# Patient Record
Sex: Male | Born: 1935 | Race: White | Hispanic: No | Marital: Married | Smoking: Never smoker
Health system: Southern US, Community
[De-identification: ages and names within clinical notes are randomized; demographics above are authoritative.]

## PROBLEM LIST (undated history)

## (undated) DIAGNOSIS — C61 Malignant neoplasm of prostate: Secondary | ICD-10-CM

## (undated) DIAGNOSIS — I10 Essential (primary) hypertension: Secondary | ICD-10-CM

## (undated) DIAGNOSIS — N200 Calculus of kidney: Secondary | ICD-10-CM

## (undated) DIAGNOSIS — I639 Cerebral infarction, unspecified: Secondary | ICD-10-CM

## (undated) DIAGNOSIS — Z87442 Personal history of urinary calculi: Secondary | ICD-10-CM

## (undated) HISTORY — PX: SHOULDER SURGERY: SHX246

## (undated) HISTORY — PX: KIDNEY STONE SURGERY: SHX686

---

## 2006-06-12 ENCOUNTER — Ambulatory Visit: Payer: Self-pay | Admitting: Internal Medicine

## 2007-06-13 ENCOUNTER — Ambulatory Visit: Payer: Self-pay | Admitting: Family Medicine

## 2007-11-30 ENCOUNTER — Ambulatory Visit: Payer: Self-pay | Admitting: Family Medicine

## 2007-12-02 ENCOUNTER — Ambulatory Visit: Payer: Self-pay | Admitting: Internal Medicine

## 2008-03-29 ENCOUNTER — Ambulatory Visit: Payer: Self-pay | Admitting: Family Medicine

## 2008-04-08 ENCOUNTER — Ambulatory Visit: Payer: Self-pay | Admitting: Internal Medicine

## 2009-06-19 ENCOUNTER — Ambulatory Visit: Payer: Self-pay | Admitting: Internal Medicine

## 2009-12-28 ENCOUNTER — Ambulatory Visit: Payer: Self-pay | Admitting: Ophthalmology

## 2010-03-23 ENCOUNTER — Ambulatory Visit: Payer: Self-pay | Admitting: Otolaryngology

## 2011-09-19 ENCOUNTER — Ambulatory Visit: Payer: Self-pay | Admitting: Ophthalmology

## 2012-03-01 DEATH — deceased

## 2012-09-01 LAB — COMPREHENSIVE METABOLIC PANEL
Albumin: 4 g/dL (ref 3.4–5.0)
Alkaline Phosphatase: 216 U/L — ABNORMAL HIGH (ref 50–136)
Anion Gap: 4 — ABNORMAL LOW (ref 7–16)
BUN: 19 mg/dL — ABNORMAL HIGH (ref 7–18)
Bilirubin,Total: 0.8 mg/dL (ref 0.2–1.0)
Chloride: 107 mmol/L (ref 98–107)
Creatinine: 1.03 mg/dL (ref 0.60–1.30)
Glucose: 124 mg/dL — ABNORMAL HIGH (ref 65–99)
Potassium: 4.2 mmol/L (ref 3.5–5.1)
SGOT(AST): 87 U/L — ABNORMAL HIGH (ref 15–37)
SGPT (ALT): 39 U/L (ref 12–78)
Total Protein: 7.9 g/dL (ref 6.4–8.2)

## 2012-09-01 LAB — CBC
HCT: 45 % (ref 40.0–52.0)
MCH: 32.6 pg (ref 26.0–34.0)
MCHC: 35.9 g/dL (ref 32.0–36.0)
MCV: 91 fL (ref 80–100)
Platelet: 211 10*3/uL (ref 150–440)
RBC: 4.95 10*6/uL (ref 4.40–5.90)
RDW: 12.8 % (ref 11.5–14.5)
WBC: 6.9 10*3/uL (ref 3.8–10.6)

## 2012-09-01 LAB — URINALYSIS, COMPLETE
Bacteria: NONE SEEN
Bilirubin,UR: NEGATIVE
Glucose,UR: NEGATIVE mg/dL (ref 0–75)
Ketone: NEGATIVE
Ph: 6 (ref 4.5–8.0)
Specific Gravity: 1.009 (ref 1.003–1.030)
Squamous Epithelial: NONE SEEN
WBC UR: <1 /HPF (ref 0–5)

## 2012-09-01 LAB — TROPONIN I: Troponin-I: <0.02 ng/mL

## 2012-09-02 ENCOUNTER — Inpatient Hospital Stay: Payer: Self-pay

## 2012-09-02 LAB — PROTIME-INR
INR: 1
Prothrombin Time: 13.5 secs (ref 11.5–14.7)

## 2012-09-02 LAB — CBC WITH DIFFERENTIAL/PLATELET
Basophil #: 0.1 10*3/uL (ref 0.0–0.1)
Eosinophil #: 0.2 10*3/uL (ref 0.0–0.7)
Eosinophil %: 3.1 %
HCT: 40.4 % (ref 40.0–52.0)
Lymphocyte #: 1.7 10*3/uL (ref 1.0–3.6)
Lymphocyte %: 26.3 %
MCH: 32.1 pg (ref 26.0–34.0)
MCHC: 35.7 g/dL (ref 32.0–36.0)
MCV: 90 fL (ref 80–100)
Neutrophil %: 59.6 %
RDW: 13.1 % (ref 11.5–14.5)
WBC: 6.5 10*3/uL (ref 3.8–10.6)

## 2012-09-02 LAB — COMPREHENSIVE METABOLIC PANEL
Anion Gap: 6 — ABNORMAL LOW (ref 7–16)
Bilirubin,Total: 0.7 mg/dL (ref 0.2–1.0)
Calcium, Total: 8.4 mg/dL — ABNORMAL LOW (ref 8.5–10.1)
Chloride: 111 mmol/L — ABNORMAL HIGH (ref 98–107)
Co2: 25 mmol/L (ref 21–32)
Creatinine: 1.01 mg/dL (ref 0.60–1.30)
EGFR (African American): >60
EGFR (Non-African Amer.): >60
Osmolality: 285 (ref 275–301)
SGPT (ALT): 36 U/L (ref 12–78)
Sodium: 142 mmol/L (ref 136–145)
Total Protein: 6.6 g/dL (ref 6.4–8.2)

## 2012-09-02 LAB — TROPONIN I
Troponin-I: <0.02 ng/mL
Troponin-I: <0.02 ng/mL

## 2012-09-02 LAB — LIPID PANEL
Cholesterol: 152 mg/dL (ref 0–200)
Ldl Cholesterol, Calc: 99 mg/dL (ref 0–100)

## 2013-06-25 ENCOUNTER — Ambulatory Visit: Payer: Self-pay | Admitting: Emergency Medicine

## 2013-10-22 DIAGNOSIS — M659 Synovitis and tenosynovitis, unspecified: Secondary | ICD-10-CM | POA: Diagnosis not present

## 2014-01-12 DIAGNOSIS — C61 Malignant neoplasm of prostate: Secondary | ICD-10-CM | POA: Diagnosis not present

## 2014-01-12 DIAGNOSIS — N529 Male erectile dysfunction, unspecified: Secondary | ICD-10-CM | POA: Diagnosis not present

## 2014-01-12 DIAGNOSIS — D400 Neoplasm of uncertain behavior of prostate: Secondary | ICD-10-CM | POA: Diagnosis not present

## 2014-01-12 DIAGNOSIS — E785 Hyperlipidemia, unspecified: Secondary | ICD-10-CM | POA: Diagnosis not present

## 2014-01-14 DIAGNOSIS — I10 Essential (primary) hypertension: Secondary | ICD-10-CM | POA: Diagnosis not present

## 2014-01-14 DIAGNOSIS — R059 Cough, unspecified: Secondary | ICD-10-CM | POA: Diagnosis not present

## 2014-01-14 DIAGNOSIS — R05 Cough: Secondary | ICD-10-CM | POA: Diagnosis not present

## 2014-01-14 DIAGNOSIS — R509 Fever, unspecified: Secondary | ICD-10-CM | POA: Diagnosis not present

## 2014-01-18 DIAGNOSIS — J309 Allergic rhinitis, unspecified: Secondary | ICD-10-CM | POA: Diagnosis not present

## 2014-01-18 DIAGNOSIS — I10 Essential (primary) hypertension: Secondary | ICD-10-CM | POA: Diagnosis not present

## 2014-01-18 DIAGNOSIS — J189 Pneumonia, unspecified organism: Secondary | ICD-10-CM | POA: Diagnosis not present

## 2014-01-18 DIAGNOSIS — R05 Cough: Secondary | ICD-10-CM | POA: Diagnosis not present

## 2014-01-18 DIAGNOSIS — E785 Hyperlipidemia, unspecified: Secondary | ICD-10-CM | POA: Diagnosis not present

## 2014-01-18 DIAGNOSIS — R059 Cough, unspecified: Secondary | ICD-10-CM | POA: Diagnosis not present

## 2014-01-20 DIAGNOSIS — H43819 Vitreous degeneration, unspecified eye: Secondary | ICD-10-CM | POA: Diagnosis not present

## 2014-02-26 ENCOUNTER — Ambulatory Visit: Payer: Self-pay

## 2014-02-26 DIAGNOSIS — R059 Cough, unspecified: Secondary | ICD-10-CM | POA: Diagnosis not present

## 2014-02-26 DIAGNOSIS — M949 Disorder of cartilage, unspecified: Secondary | ICD-10-CM | POA: Diagnosis not present

## 2014-02-26 DIAGNOSIS — R918 Other nonspecific abnormal finding of lung field: Secondary | ICD-10-CM | POA: Diagnosis not present

## 2014-02-26 DIAGNOSIS — J9819 Other pulmonary collapse: Secondary | ICD-10-CM | POA: Diagnosis not present

## 2014-02-26 DIAGNOSIS — M899 Disorder of bone, unspecified: Secondary | ICD-10-CM | POA: Diagnosis not present

## 2014-07-13 DIAGNOSIS — N5201 Erectile dysfunction due to arterial insufficiency: Secondary | ICD-10-CM | POA: Diagnosis not present

## 2014-07-13 DIAGNOSIS — D400 Neoplasm of uncertain behavior of prostate: Secondary | ICD-10-CM | POA: Diagnosis not present

## 2014-07-13 DIAGNOSIS — C61 Malignant neoplasm of prostate: Secondary | ICD-10-CM | POA: Diagnosis not present

## 2014-08-03 DIAGNOSIS — Z23 Encounter for immunization: Secondary | ICD-10-CM | POA: Diagnosis not present

## 2014-09-01 DIAGNOSIS — Z79899 Other long term (current) drug therapy: Secondary | ICD-10-CM | POA: Diagnosis not present

## 2014-09-01 DIAGNOSIS — Z0000 Encounter for general adult medical examination without abnormal findings: Secondary | ICD-10-CM | POA: Diagnosis not present

## 2014-09-06 DIAGNOSIS — Z961 Presence of intraocular lens: Secondary | ICD-10-CM | POA: Diagnosis not present

## 2014-09-08 DIAGNOSIS — I10 Essential (primary) hypertension: Secondary | ICD-10-CM | POA: Diagnosis not present

## 2014-09-08 DIAGNOSIS — I639 Cerebral infarction, unspecified: Secondary | ICD-10-CM | POA: Diagnosis not present

## 2014-09-08 DIAGNOSIS — E785 Hyperlipidemia, unspecified: Secondary | ICD-10-CM | POA: Diagnosis not present

## 2014-09-08 DIAGNOSIS — E119 Type 2 diabetes mellitus without complications: Secondary | ICD-10-CM | POA: Diagnosis not present

## 2014-11-01 DIAGNOSIS — L720 Epidermal cyst: Secondary | ICD-10-CM | POA: Diagnosis not present

## 2014-11-16 DIAGNOSIS — Z4802 Encounter for removal of sutures: Secondary | ICD-10-CM | POA: Diagnosis not present

## 2015-01-12 DIAGNOSIS — D400 Neoplasm of uncertain behavior of prostate: Secondary | ICD-10-CM | POA: Diagnosis not present

## 2015-01-18 NOTE — Consult Note (Signed)
Brief Consult Note: Diagnosis: right occipital stroke - Rt. PCA.   Patient was seen by consultant.   Consult note dictated.   Comments: 1) transient left sided visual difficulty, mild right periorbital pain - still has some difficulty recognizing objection in far left visual field. - Agree with incresing ASA to 325, conti statin, ACE. - should get OSA eval as out pt (not many other vascular risk factors but has extensive WM disease on MRI brain). - No driving till formal perimetry by opthal as out pt - discussed with Dr. Ola Spurr.  Electronic Signatures: Ray Church (MD)  (Signed 03-Dec-13 13:44)  Authored: Brief Consult Note   Last Updated: 03-Dec-13 13:44 by Ray Church (MD)

## 2015-01-18 NOTE — Consult Note (Signed)
PATIENT NAME:  Darryl Roman, HERRINGTON MR#:  [id number] DATE OF BIRTH:  Jul 21, 1933  DATE OF CONSULTATION:  09/02/2012  REFERRING PHYSICIAN:  Margaretha Sheffield, MD CONSULTING PHYSICIAN:  Argenis Kumari K. Manuella Ghazi, MD  PRIMARY CARE PHYSICIAN: Adrian Prows, MD  REASON FOR CONSULTATION: Visual field defect and concern for stroke and headache.   HISTORY OF PRESENT ILLNESS: Darryl Roman is a 81 year old Caucasian gentleman in very good health in general, plays sports on a regular basis, etc. He had feeling of not good on Thursday, 08/28/2012. He could not describe to me further what he meant by not feeling well. He did not describe palpitations or any other focal neurological symptoms that day.   He said Friday and Saturday were "good days". On Sunday, around 10:40 in the morning when he was driving to church, he did not recognize two cars coming from the left side when he made a right turn.   He also had some difficulty recognizing other cars into his left visual field.   Later on in the day, he had some periorbital headache on the right side. He thought it might be coming from his sinuses and went to see Dr. Kathyrn Sheriff.  He did not find much signs of inflammatory sinusitis and advised the patient to go to the ER after discussing the patient with me on the telephone.   Since admission, the patient has not had any new symptoms. On his MRI of the brain, he does have a positive DWI lesion on his right occipital region.   PAST MEDICAL HISTORY:  1. Prostate cancer. 2. Hypertension. 3. Cataract.   PAST SURGICAL HISTORY: Cataract surgery bilaterally in 2012.   FAMILY HISTORY: Significant for stroke in his father in his 53s and a brother with two silent strokes.   SOCIAL HISTORY: Negative for smoking. No alcohol. No drug abuse. He lives with his wife. He is very active in sports.   MEDICATIONS: I reviewed his home medication list.   REVIEW OF SYSTEMS: Negative, except for History of Present Illness.   PHYSICAL  EXAMINATION:   VITAL SIGNS: Temperature 97.9, pulse 60, respiratory rate 20, blood pressure 165/69, and pulse oximetry 96% on room air.   GENERAL: He is a well-built, well-nourished Caucasian gentleman lying in bed, not in acute distress. He was actually eating.   PULMONARY: Lungs are clear to auscultation.   CARDIAC: S1 and S2 heart sounds. Carotid exam did not reveal any bruit.   NEUROLOGIC: He was alert and oriented and followed two-step inverted commands. His language was intact. He does not have any neurological neglect. His attention, concentration, and memory seem to be appropriate for his age.   On his cranial nerves, his pupils are equal, round, and reactive. Extraocular movements were intact. He does have some difficulty in his left visual field, upper and lower quadrant, recognizing finger movements. He feels like there is a hand,  but he just could not recognize whether the fingers were moving or not.   His face was symmetric. Tongue was midline. Facial sensations were intact. His hearing seems to be intact as well.   On his motor examination, he moved all his extremities symmetrically. His strength was five out of five. His reflexes were 1+. His sensations were intact to light touch. His coordination seems to be okay.   I did not check his gait.   RADIOLOGIC STUDIES: On review of his radiologic data, on MRI of the brain he does have right anterior temporal subarachnoid cyst.  He has a  new DWI lesion suggestive of cytotoxic edema into his right occipital cortex, more occipitotemporal region, in inferior surface.  He also has extensive white matter microvascular ischemic changes.   ASSESSMENT AND PLAN:  1. Stroke with left visual field difficulties rather than hemianopsia. He has more difficulty with recognizing objects into his left hemifield compared to the right hemifield.   Evidenced by he was not able to recognize cars coming from his left side when he was driving.    The patient denied any history suggestive of transient ischemic attack or palpitations, etc.   This vascular distribution is right posterior cerebral artery.   I agree with the patient's stroke work-up of telemetry monitoring, echocardiogram, carotid ultrasound, etc.   On his lipid panel, his total cholesterol is 152, triglyceride is 138, HDL is 25, and LDL is 99.   The patient should get visual rehab and I talked to him about ways of improving vision and be cognisant about visual field deficit.   For now he will not drive until he gets formal perimetry done by ophthalmology as an outpatient.   In terms of treatment, he was taking aspirin 81 mg. He  can be switched over to aspirin 325 mg, as Dr. Adrian Prows has done.   The patient should continue to take his statin. In the future, niacin can be considered to elevate his HDL.   The patient should avoid drastic lowering of his blood pressure in peristroke period to avoid any worsening of his penumbra, but in the long run he should have much tighter blood pressure control.   The patient does not have diabetes or smoking history.   The patient mentioned that he snores and his wife has noticed some apneic spells at nighttime, but he does not have excessive daytime sleepiness or fatigue or early morning headaches or mouth dryness, etc., but he should be considered for obstructive sleep apnea evaluation which can be one of the vascular risk factors for him.   I will follow this patient as an outpatient in 1 to 2 months.  2. 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 He does not require any further intervention.   3. Extensive white matter microvascular ischemic changes puts him at high risk of developing vascular cognitive decline in the future, but the patient has a very good healthy lifestyle that he should continue.  ____________________________ Treylon Henard K. Manuella Ghazi,  MD hks:slb D: 09/02/2012 13:53:40 ET T: 09/02/2012 14:10:54 ET JOB#: [id number]  cc: Geniece Akers K. Manuella Ghazi, MD, <Dictator> Cheral Marker. Ola Spurr, MD Huey Romans, MD [facility] Raliegh Ip [facility] MD ELECTRONICALLY SIGNED 09/10/2012 9:41

## 2015-01-18 NOTE — H&P (Signed)
PATIENT NAME:  Darryl Roman, Darryl Roman MR#:  [id number] DATE OF BIRTH:  03-Oct-1932  DATE OF ADMISSION:  09/01/2012  PRIMARY CARE PHYSICIAN: Dr. Andrey Farmer and now will be seeing Dr. Ola Spurr  REQUESTING PHYSICIAN: Dr. Michel Santee  CHIEF COMPLAINT: Blurry vision, headache and concern for mini stroke.   HISTORY OF PRESENT ILLNESS: Patient is a 81 year old white male with a known history of prostate cancer, hypertension is being admitted to rule out stroke and/or possible mini stroke. Patient started having sinus-like symptoms since yesterday while having a lot of tenderness in the right maxillary and frontal sinus area, went to see Dr. Kathyrn Sheriff. He was also having trouble focusing and had some blurred vision last Tuesday then it resolved, again started having similar issue during Thanksgiving holiday. He was also having a lot of pain under his right eyeball and when he was driving today he felt blind spot on the left side and almost felt like he was going to get into accident as he could not see for a few seconds. Dr. Maisie Fus office checked him out and requested him to come to the Emergency Department for evaluation of stroke. While in the ED he had a CT scan of the head which did not show any acute intracranial abnormality. He is being admitted for further evaluation and management.   PAST MEDICAL HISTORY:  1. Prostate cancer. 2. Hypertension.   ALLERGIES: No known drug allergies.   SOCIAL HISTORY: No smoking. No alcohol. No IV drugs of abuse.   FAMILY HISTORY: Father had a stroke in his 76s.    PAST SURGICAL HISTORY: Cataract surgery in 2012 on both sides.   MEDICATIONS AT HOME:  1. Benazepril 10 mg p.o. daily.  2. Aspirin 81 mg p.o. daily. 3. Amlodipine 5 mg p.o. daily.   REVIEW OF SYSTEMS: CONSTITUTIONAL: No fever, fatigue, weakness. EYES: Positive for blurry vision and some blind spot in it. ENT: Positive for some sinus headache on the right side. Also has hearing difficulty which is chronic.  LUNGS: No cough, wheezing, hemoptysis. CARDIOVASCULAR: No chest pain, orthopnea, edema. GASTROINTESTINAL: No nausea, vomiting, diarrhea. GENITOURINARY: No dysuria or hematuria. ENDOCRINE: No polyuria or nocturia. HEMATOLOGY: No anemia or easy bruising. SKIN: No rash or lesion. MUSCULOSKELETAL: No arthritis or muscle cramp. NEUROLOGIC: No tingling, numbness, weakness. PSYCHIATRIC: No history of anxiety or depression.   PHYSICAL EXAMINATION:  VITAL SIGNS: Temperature 98, heart rate 88 per minute, respirations 18 per minute, blood pressure 193/90 mmHg. He is saturating 100% on room air.  GENERAL: Patient is a 81 year old male lying in the bed comfortably without any acute distress.   EYES: Pupils equal, round, reactive to light and accommodation. No scleral icterus. Extraocular muscles intact.   HENT: Head atraumatic, normocephalic. Oropharynx and nasopharynx clear.   NECK: Supple. No jugular venous distention. No thyroid enlargement or tenderness.   LUNGS: Clear to auscultation bilaterally. No wheezing, rales, rhonchi, crepitation.   CARDIOVASCULAR: S1, S2 normal. No murmurs, rales or gallop.   ABDOMEN: Soft, nontender, nondistended. Bowel sounds present. No organomegaly, masses.    EXTREMITIES: No pedal edema, cyanosis, clubbing.   NEUROLOGIC: Nonfocal examination. Cranial nerves II through XII intact. Muscle strength 5/5. Extremity sensation intact.   PSYCH: Patient is oriented to time, place and person x3.   SKIN: No obvious rash, lesion, ulcer.   LABORATORY, DIAGNOSTIC AND RADIOLOGICAL DATA: Normal BMP. Normal liver function tests except alkaline phosphatase of 216.   AST of 87.   Normal CBC.  Negative urinalysis.  EKG showed no acute  ST-T changes.   CT scan of the head without contrast in the ED showed chronic small vessel ischemic disease. No acute intracranial hemorrhage. Frontal sinus mucoperiosteal thickening. Bifrontal cerebral atrophy and atrophy of the anterior aspect  of the right temporal lobe.   He does have decreased density in the deep white matter of both cerebral hemispheres consistent with chronic small vessel ischemic disease.   Bilateral carotid Doppler's on admission showed no hemodynamically significant stenosis.   IMPRESSION AND PLAN:  1. Suspected transient ischemic attack/cerebrovascular accident. Will obtain MRI of the brain, get carotid Doppler's and obtain 2-D echo. Will start him on aspirin and statin and blood pressure medicine to get his blood pressure under better control. This could be due to possible acute cerebrovascular accident.  2. Uncontrolled hypertension. Again could be due to acute cerebrovascular accident or transient ischemic attack. Will add hydralazine for better blood pressure control and continue rest of his home blood pressure medications.  3. Blurry vision/headache. Could be due to possible transient ischemic attack and/or cerebrovascular accident. He already had an ENT ruling out sinus disease. If stroke work-up comes negative he may need outpatient ophthalmology follow up for further evaluation.  4. CODE STATUS: Full code.   TOTAL TIME TAKING CARE OF THIS PATIENT: 55 minutes.  ____________________________ Lucina Mellow. Manuella Ghazi, MD vss:cms D: 09/01/2012 17:27:17 ET T: 09/01/2012 17:49:46 ET  JOB#: [id number] cc: Cheral Marker. Ola Spurr, MD [place] MD ELECTRONICALLY SIGNED 09/02/2012 21:12

## 2015-01-18 NOTE — Discharge Summary (Signed)
PATIENT NAME:  Darryl Roman, Darryl Roman MR#:  [id number] DATE OF BIRTH:  07/10/1933  DATE OF ADMISSION:  09/02/2012 DATE OF DISCHARGE:  09/03/2012  DISCHARGE DIAGNOSES:  1. Acute cerebrovascular accident in the parietal occipital lobe presenting with visual field loss.  2. Hypertensive urgency.   PRIMARY CARE PHYSICIAN: Dr. Adrian Prows   CONSULTING: Neurologist, Dr. Manuella Ghazi.   HISTORY OF PRESENT ILLNESS: Please see admission history and physical from 12/02. Briefly, patient is 83, relatively healthy except for hypertension who was admitted with several days of recurring visual symptoms including difficulty seeing cars and also right-sided headache. He was seen by Dr. Kathyrn Sheriff in ENT and referred to the ED for evaluation of transient ischemic attack.   HOSPITAL COURSE BY ISSUE:  1. Cerebrovascular accident. Patient was diagnosed with an acute cerebrovascular accident based on an MRI that showed the findings in the right parietal occipital lobe. On admission he was started on atorvastatin as well as increased from 81 mg of aspirin to 325. His blood pressure was allowed to remain somewhat high but his medications were adjusted as needed. He had no rehab or physical therapy needs as he was mobile but persisted with some visual field deficits. He will follow up as an outpatient with Dr. Manuella Ghazi as well as myself. He will also see ophthalmology for formal visual field testing.  2. Hypertension. Blood pressure was quite elevated on admission in the setting of CVA. He was allowed to have some permissive hypertension. However, his Norvasc was increased to 10 mg from 5 mg. He was continued on benazepril 10 mg once a day. He was also started on hydralazine 10 mg q.i.d. He will be discharged on these medications and adjusted as an outpatient. He would probably benefit from addition of a mild diuretic in place of the hydralazine as an outpatient.   LABORATORY, DIAGNOSTIC AND RADIOLOGICAL DATA: Significant findings: MRI  showed acute nonhemorrhagic right parietal occipital infarct. Echocardiogram revealed normal ejection fraction at 55%. Mild left atrial dilation and mild mitral regurgitation. Tricuspid valve was not well visualized. Rest of the exam was normal. Carotid Doppler's revealed no significant hemodynamic compromise. LDL level was 99. C-MET and urinalysis and CBC were normal. Troponin levels were negative.   DISCHARGE MEDICATIONS:  1. Aspirin 325 once a day.  2. Norvasc 10 mg once a day.  3. Hydralazine 10 mg tablets 1 tablet q.i.d.  4. Atorvastatin 20 mg once a day.  5. Benazepril 10 mg once a day.   DISPOSITION: Discharged to home.   DISCHARGE INSTRUCTIONS: Patient is to continue activities as tolerated. He should not drive until he has formal ophthalmological testing for visual fields. He is to call or return if he has worsening vision, headaches, or other concerning symptoms.   DISCHARGE DIET: Low salt, low fat diet.   DISCHARGE FOLLOW UP: Patient will follow up with me in 1 to 2 weeks and with Dr. Manuella Ghazi in one month. Patient will also arrange ophthalmologic evaluation.   TIME SPENT: This discharge took 35 minutes.   ____________________________ Cheral Marker. Ola Spurr, MD dpf:cms D: 09/03/2012 10:12:53 ET T: 09/03/2012 12:05:38 ET JOB#: [id number]  cc: Cheral Marker. Ola Spurr, MD, <Dictator> [place] K. Manuella Ghazi, MD [place] MD ELECTRONICALLY SIGNED 09/18/2012 12:57

## 2015-03-07 DIAGNOSIS — I10 Essential (primary) hypertension: Secondary | ICD-10-CM | POA: Diagnosis not present

## 2015-03-07 DIAGNOSIS — E785 Hyperlipidemia, unspecified: Secondary | ICD-10-CM | POA: Diagnosis not present

## 2015-03-07 DIAGNOSIS — E119 Type 2 diabetes mellitus without complications: Secondary | ICD-10-CM | POA: Diagnosis not present

## 2015-03-14 DIAGNOSIS — I10 Essential (primary) hypertension: Secondary | ICD-10-CM | POA: Diagnosis not present

## 2015-03-14 DIAGNOSIS — R7989 Other specified abnormal findings of blood chemistry: Secondary | ICD-10-CM | POA: Diagnosis not present

## 2015-03-14 DIAGNOSIS — I639 Cerebral infarction, unspecified: Secondary | ICD-10-CM | POA: Diagnosis not present

## 2015-03-14 DIAGNOSIS — E780 Pure hypercholesterolemia: Secondary | ICD-10-CM | POA: Diagnosis not present

## 2015-03-14 DIAGNOSIS — R5382 Chronic fatigue, unspecified: Secondary | ICD-10-CM | POA: Diagnosis not present

## 2015-03-15 ENCOUNTER — Other Ambulatory Visit: Payer: Self-pay | Admitting: Infectious Diseases

## 2015-03-15 DIAGNOSIS — I639 Cerebral infarction, unspecified: Secondary | ICD-10-CM

## 2015-03-15 DIAGNOSIS — R945 Abnormal results of liver function studies: Secondary | ICD-10-CM

## 2015-03-15 DIAGNOSIS — I10 Essential (primary) hypertension: Secondary | ICD-10-CM

## 2015-03-15 DIAGNOSIS — R7989 Other specified abnormal findings of blood chemistry: Secondary | ICD-10-CM

## 2015-03-15 DIAGNOSIS — E7800 Pure hypercholesterolemia, unspecified: Secondary | ICD-10-CM

## 2015-03-18 ENCOUNTER — Ambulatory Visit
Admission: RE | Admit: 2015-03-18 | Discharge: 2015-03-18 | Disposition: A | Discharge status: Home / Self Care | Payer: Medicare Other | Source: Ambulatory Visit | Attending: Infectious Diseases | Admitting: Infectious Diseases

## 2015-03-18 DIAGNOSIS — R945 Abnormal results of liver function studies: Secondary | ICD-10-CM

## 2015-03-18 DIAGNOSIS — I10 Essential (primary) hypertension: Secondary | ICD-10-CM | POA: Diagnosis not present

## 2015-03-18 DIAGNOSIS — R7989 Other specified abnormal findings of blood chemistry: Secondary | ICD-10-CM | POA: Insufficient documentation

## 2015-03-18 DIAGNOSIS — I639 Cerebral infarction, unspecified: Secondary | ICD-10-CM

## 2015-03-18 DIAGNOSIS — E7800 Pure hypercholesterolemia, unspecified: Secondary | ICD-10-CM

## 2015-03-18 DIAGNOSIS — N281 Cyst of kidney, acquired: Secondary | ICD-10-CM | POA: Diagnosis not present

## 2015-03-18 DIAGNOSIS — N261 Atrophy of kidney (terminal): Secondary | ICD-10-CM | POA: Diagnosis not present

## 2015-08-12 DIAGNOSIS — Z23 Encounter for immunization: Secondary | ICD-10-CM | POA: Diagnosis not present

## 2015-09-09 DIAGNOSIS — R7989 Other specified abnormal findings of blood chemistry: Secondary | ICD-10-CM | POA: Diagnosis not present

## 2015-09-09 DIAGNOSIS — I10 Essential (primary) hypertension: Secondary | ICD-10-CM | POA: Diagnosis not present

## 2015-09-09 DIAGNOSIS — I639 Cerebral infarction, unspecified: Secondary | ICD-10-CM | POA: Diagnosis not present

## 2015-09-09 DIAGNOSIS — E7800 Pure hypercholesterolemia, unspecified: Secondary | ICD-10-CM | POA: Diagnosis not present

## 2015-09-16 DIAGNOSIS — Z8546 Personal history of malignant neoplasm of prostate: Secondary | ICD-10-CM | POA: Diagnosis not present

## 2015-09-16 DIAGNOSIS — Z23 Encounter for immunization: Secondary | ICD-10-CM | POA: Diagnosis not present

## 2015-09-16 DIAGNOSIS — I639 Cerebral infarction, unspecified: Secondary | ICD-10-CM | POA: Diagnosis not present

## 2015-09-16 DIAGNOSIS — I10 Essential (primary) hypertension: Secondary | ICD-10-CM | POA: Diagnosis not present

## 2015-09-16 DIAGNOSIS — K219 Gastro-esophageal reflux disease without esophagitis: Secondary | ICD-10-CM | POA: Diagnosis not present

## 2015-12-30 DIAGNOSIS — H35371 Puckering of macula, right eye: Secondary | ICD-10-CM | POA: Diagnosis not present

## 2016-02-01 ENCOUNTER — Telehealth: Payer: Self-pay | Admitting: Infectious Diseases

## 2016-02-01 ENCOUNTER — Ambulatory Visit: Payer: Medicare Other

## 2016-02-01 ENCOUNTER — Ambulatory Visit
Admission: EM | Admit: 2016-02-01 | Discharge: 2016-02-01 | Disposition: A | Discharge status: Home / Self Care | Payer: Medicare Other | Attending: Family Medicine | Admitting: Family Medicine

## 2016-02-01 ENCOUNTER — Encounter: Payer: Self-pay | Admitting: Emergency Medicine

## 2016-02-01 ENCOUNTER — Ambulatory Visit
Admit: 2016-02-01 | Discharge: 2016-02-01 | Disposition: A | Discharge status: Home / Self Care | Payer: Medicare Other | Attending: Family Medicine | Admitting: Family Medicine

## 2016-02-01 DIAGNOSIS — I2510 Atherosclerotic heart disease of native coronary artery without angina pectoris: Secondary | ICD-10-CM | POA: Diagnosis not present

## 2016-02-01 DIAGNOSIS — Z8673 Personal history of transient ischemic attack (TIA), and cerebral infarction without residual deficits: Secondary | ICD-10-CM | POA: Insufficient documentation

## 2016-02-01 DIAGNOSIS — R791 Abnormal coagulation profile: Secondary | ICD-10-CM

## 2016-02-01 DIAGNOSIS — M4324 Fusion of spine, thoracic region: Secondary | ICD-10-CM | POA: Diagnosis not present

## 2016-02-01 DIAGNOSIS — I313 Pericardial effusion (noninflammatory): Secondary | ICD-10-CM | POA: Diagnosis not present

## 2016-02-01 DIAGNOSIS — S8012XA Contusion of left lower leg, initial encounter: Secondary | ICD-10-CM

## 2016-02-01 DIAGNOSIS — M7989 Other specified soft tissue disorders: Secondary | ICD-10-CM | POA: Diagnosis not present

## 2016-02-01 DIAGNOSIS — R05 Cough: Secondary | ICD-10-CM | POA: Insufficient documentation

## 2016-02-01 DIAGNOSIS — R509 Fever, unspecified: Secondary | ICD-10-CM | POA: Insufficient documentation

## 2016-02-01 DIAGNOSIS — Z9889 Other specified postprocedural states: Secondary | ICD-10-CM | POA: Diagnosis not present

## 2016-02-01 DIAGNOSIS — Z79899 Other long term (current) drug therapy: Secondary | ICD-10-CM | POA: Diagnosis not present

## 2016-02-01 DIAGNOSIS — Z7982 Long term (current) use of aspirin: Secondary | ICD-10-CM | POA: Diagnosis not present

## 2016-02-01 DIAGNOSIS — R059 Cough, unspecified: Secondary | ICD-10-CM

## 2016-02-01 DIAGNOSIS — R7989 Other specified abnormal findings of blood chemistry: Secondary | ICD-10-CM

## 2016-02-01 HISTORY — DX: Calculus of kidney: N20.0

## 2016-02-01 HISTORY — DX: Essential (primary) hypertension: I10

## 2016-02-01 HISTORY — DX: Cerebral infarction, unspecified: I63.9

## 2016-02-01 LAB — URINALYSIS COMPLETE WITH MICROSCOPIC (ARMC ONLY)
Bacteria, UA: NONE SEEN
GLUCOSE, UA: NEGATIVE mg/dL
HGB URINE DIPSTICK: NEGATIVE
Ketones, ur: NEGATIVE mg/dL
Leukocytes, UA: NEGATIVE
NITRITE: NEGATIVE
PH: 5.5 (ref 5.0–8.0)
Protein, ur: 30 mg/dL — AB
SPECIFIC GRAVITY, URINE: 1.025 (ref 1.005–1.030)

## 2016-02-01 LAB — CBC WITH DIFFERENTIAL/PLATELET
Basophils Absolute: 0.1 10*3/uL (ref 0–0.1)
EOS ABS: 0.4 10*3/uL (ref 0–0.7)
HEMATOCRIT: 45.9 % (ref 40.0–52.0)
Hemoglobin: 15.9 g/dL (ref 13.0–18.0)
Lymphocytes Relative: 8 %
Lymphs Abs: 0.8 10*3/uL — ABNORMAL LOW (ref 1.0–3.6)
MCH: 31.7 pg (ref 26.0–34.0)
MCHC: 34.6 g/dL (ref 32.0–36.0)
MCV: 91.5 fL (ref 80.0–100.0)
MONO ABS: 1 10*3/uL (ref 0.2–1.0)
NEUTROS ABS: 7.7 10*3/uL — AB (ref 1.4–6.5)
Neutrophils Relative %: 77 %
Platelets: 200 10*3/uL (ref 150–440)
RBC: 5.02 MIL/uL (ref 4.40–5.90)
RDW: 12.8 % (ref 11.5–14.5)
WBC: 10 10*3/uL (ref 3.8–10.6)

## 2016-02-01 LAB — BASIC METABOLIC PANEL
Anion gap: 6 (ref 5–15)
BUN: 24 mg/dL — AB (ref 6–20)
CO2: 24 mmol/L (ref 22–32)
Calcium: 8.9 mg/dL (ref 8.9–10.3)
Chloride: 108 mmol/L (ref 101–111)
Creatinine, Ser: 1.21 mg/dL (ref 0.61–1.24)
GFR, EST NON AFRICAN AMERICAN: 55 mL/min — AB (ref >60)
GLUCOSE: 102 mg/dL — AB (ref 65–99)
Potassium: 3.8 mmol/L (ref 3.5–5.1)
Sodium: 138 mmol/L (ref 135–145)

## 2016-02-01 LAB — RAPID INFLUENZA A&B ANTIGENS: Influenza B (ARMC): NEGATIVE

## 2016-02-01 LAB — RAPID INFLUENZA A&B ANTIGENS (ARMC ONLY): INFLUENZA A (ARMC): NEGATIVE

## 2016-02-01 LAB — FIBRIN DERIVATIVES D-DIMER (ARMC ONLY): FIBRIN DERIVATIVES D-DIMER (ARMC): 1075 — AB (ref 0–499)

## 2016-02-01 MED ORDER — IOPAMIDOL (ISOVUE-370) INJECTION 76%
100.0000 mL | Freq: Once | INTRAVENOUS | Status: AC | PRN
  Administered 2016-02-01: 100 mL via INTRAVENOUS

## 2016-02-01 MED ORDER — CEFUROXIME AXETIL 500 MG PO TABS: 500.0000 mg | ORAL_TABLET | Freq: Two times a day (BID) | ORAL | Status: AC

## 2016-02-01 MED ORDER — ACETAMINOPHEN 500 MG PO TABS: 1000.0000 mg | ORAL_TABLET | Freq: Four times a day (QID) | ORAL | Status: DC | PRN

## 2016-02-01 MED ORDER — CEFUROXIME AXETIL 500 MG PO TABS: 500.0000 mg | ORAL_TABLET | Freq: Two times a day (BID) | ORAL | Status: DC

## 2016-02-01 NOTE — Telephone Encounter (Signed)
Patient and spouse notified of CT Chest/angio results via telephone.  Case discussed with Dr Alveta Heimlich.  Will treat for possible early pneumonia ceftin 500mg  po BID as possible early cellulitis left lower extremity as previously discussed with Dr Posey Pronto during shift change also.  Patient to follow up with PCM if any worsening or no improvement of chest symptoms/fever after 48 hours on antibiotics.  Patient or spouse may pick up copy of CT radiology report at front desk during office hours.   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  Patient and spouse verbalized understanding information/instructions, agreed with plan of care and had no further questions at this time.  CLINICAL DATA: Elevated D-dimer. Cough. History of trauma to the left lower extremity. Evaluate for pulmonary embolism  EXAM: CT ANGIOGRAPHY CHEST WITH CONTRAST  TECHNIQUE: Multidetector CT imaging of the chest was performed using the standard protocol during bolus administration of intravenous contrast. Multiplanar CT image reconstructions and MIPs were obtained to evaluate the vascular anatomy.  CONTRAST: 100 cc Isovue 370  COMPARISON: Chest CTA -02/26/2014  FINDINGS: Vascular Findings:  There is adequate opacification of the pulmonary arterial system with the main pulmonary artery measuring 366 Hounsfield units. There are no discrete filling defects within the pulmonary arterial tree to suggest pulmonary embolism. Normal caliber of the main pulmonary artery.  Normal heart size. Small pericardial effusion, likely physiologic. Coronary artery calcifications.  Moderate amount of eccentric slightly irregular mixed calcified and noncalcified atherosclerotic plaque throughout the thoracic aorta, not resulting in a hemodynamically significant stenosis. The thoracic aorta is of normal caliber without evidence of thoracic aortic dissection is nongated examination. Conventional configuration of the  aortic arch. The branch vessels of the aortic arch are widely patent throughout their imaged course.  Review of the MIP images confirms the above findings.   ----------------------------------------------------------------------------------  Nonvascular Findings:  Mediastinum/Lymph Nodes: Supraclavicular, hilar and mediastinal lymph nodes appear similar to the 02/26/2014 examination with index right supraclavicular lymph node measuring 0.9 cm in greatest short axis diameter (image 9, series 4) index precarinal nodal conglomeration measuring approximately 1.5 cm (image 60, series 4), index right suprahilar lymph node measuring approximately 1.4 cm (image 69) and index left infrahilar lymph node measuring approximately 1.2 cm (image 85, series 4), which given stability 01/2014 examination is favored to be reactive in etiology. Approximately 1.3 cm presumed lymph node adjacent to the right-side of the T9-T10 intervertebral disc space (image 107, series 4) is also unchanged since 02/26/2014 examination. No axillary lymphadenopathy.  Lungs/Pleura: Minimal dependent subpleural ground-glass atelectasis. There is minimal subsegmental atelectasis within the left lower lobe. No focal airspace opacities. No pleural effusion or pneumothorax. The central pulmonary airways appear widely patent.  Punctate areas of nodular pleural parenchymal thickening about the known lung apex is grossly unchanged since the 01/2019 06/2014 examination (representative image 33, series 6). No discrete pulmonary nodules.  Upper abdomen: Limited early arterial phase evaluation of the upper abdomen demonstrates a punctate (approximately 0.7 cm hypo attenuating lesion within the subcapsular aspect of the lateral segment of the left lobe of the liver which is too small to adequately characterize though morphologically similar to the 01/2014 examination and favored to represent a hepatic  cyst.  Musculoskeletal: No acute or aggressive osseous abnormalities. 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 of DISH throughout the thoracic spine. Mild bilateral symmetric gynecomastia. Post bilateral rotator cuff repair. Normal appearance of the thyroid gland. Suspected atrophy involving the left kidney, incompletely imaged.  IMPRESSION: 1. No  acute cardiopulmonary disease. Specifically, no evidence of pulmonary embolism. 2. Atherosclerosis including coronary artery calcifications. 3. Supraclavicular, mediastinal, hilar and paraspinal adenopathy is unchanged since the 01/2014 examination and thus favored to be of benign etiology, potentially reactive or inflammatory in etiology. 4. 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   Electronically Signed  By: Sandi Mariscal M.D.  On: 02/01/2016 16:23

## 2016-02-01 NOTE — ED Provider Notes (Signed)
CSN: [id number]     Arrival date & time 02/01/16  1118 History   First MD Initiated Contact with Patient 02/01/16 1130     Chief Complaint  Patient presents with  . Cough  . Fever   (Consider location/radiation/quality/duration/timing/severity/associated sxs/prior Treatment) HPI: Patient presents today with history of fever yesterday. Patient has had a cough for the last few days. He denies any chest pain or shortness of breath. The last time he took Tylenol was 2 AM. Patient also states that he was hit by a softball to his left lower leg about a week and a half ago. Patient states that there is some bruising and swelling and tenderness of the site still. He does not state that it is worse in any way. He denies any calf pain. He denies any history of DVT in the past. He denies any skin breaks over the left lower leg. There was no bleeding or discharge from the site. He denies nausea, vomiting, abdominal pain, headache, urinary symptoms. Did have two non-bloody loose stools yesterday. Patient admits to having the influenza vaccination this season and pneumococcal vaccine.  Past Medical History  Diagnosis Date  . Stroke ([place])   . Kidney stones    Past Surgical History  Procedure Laterality Date  . Shoulder surgery     History reviewed. No pertinent family history. Social History  Substance Use Topics  . Smoking status: Never Smoker   . Smokeless tobacco: None  . Alcohol Use: No    Review of Systems: Negative except mentioned above.   Allergies  Review of patient's allergies indicates no known allergies.  Home Medications   Prior to Admission medications   Medication Sig Start Date End Date Taking? Authorizing Provider  amLODipine (NORVASC) 10 MG tablet Take 10 mg by mouth daily.   Yes Historical Provider, MD  aspirin 81 MG tablet Take 81 mg by mouth daily.   Yes Historical Provider, MD  atorvastatin (LIPITOR) 10 MG tablet Take 10 mg by mouth daily.   Yes Historical Provider, MD   losartan (COZAAR) 25 MG tablet Take 25 mg by mouth daily.   Yes Historical Provider, MD  Misc Natural Products (OSTEO BI-FLEX ADV DOUBLE ST PO) Take 1 tablet by mouth 2 (two) times daily.   Yes Historical Provider, MD  vitamin B-12 (CYANOCOBALAMIN) 1000 MCG tablet Take 1,000 mcg by mouth daily.   Yes Historical Provider, MD   Meds Ordered and Administered this Visit  Medications - No data to display  BP 128/57 mmHg  Pulse 67  Temp(Src) 97.6 F (36.4 C) (Tympanic)  Resp 16  Ht 5\' 9"  (1.753 m)  Wt 192 lb (87.091 kg)  BMI 28.34 kg/m2  SpO2 97% No data found.   Physical Exam   GENERAL: NAD HEENT: mild pharyngeal erythema, no exudate, no erythema of TMs, no cervical LAD RESP: CTA B CARD: RRR ABD: +BS, NT, no flank tenderness MSK: LLE- small hematoma over anterior lower leg, mild ecchymosis and tenderness of the area, no significant warmth compared to the right, -[place], FROM, no streaks, no skin breaks appreciated   ED Course  Procedures (including critical care time)  Labs Review Labs Reviewed  RAPID INFLUENZA A&B ANTIGENS ([facility] ONLY)    Imaging Review No results found.   MDM  A/P: Cough, fever, left lower extremity injury and swelling- x-rays did not show any acute process, white blood cell count was 10, given elevated d-dimer will do CT chest to rule out PE, if negative will likely  treat for cellulitis of the lower extremity with close follow-up by PMD or here. I am signing out case to Gracy Bruins nurse practitioner.    Paulina Fusi, MD 02/01/16 1416

## 2016-02-01 NOTE — Discharge Instructions (Signed)
Viral Gastroenteritis °Viral gastroenteritis is also known as stomach flu. This condition affects the stomach and intestinal tract. It can cause sudden diarrhea and vomiting. The illness typically lasts 3 to 8 days. Most people develop an immune response that eventually gets rid of the virus. While this natural response develops, the virus can make you quite ill. °CAUSES  °Many different viruses can cause gastroenteritis, such as rotavirus or noroviruses. You can catch one of these viruses by consuming contaminated food or water. You may also catch a virus by sharing utensils or other personal items with an infected person or by touching a contaminated surface. °SYMPTOMS  °The most common symptoms are diarrhea and vomiting. These problems can cause a severe loss of body fluids (dehydration) and a body salt (electrolyte) imbalance. Other symptoms may include: °· Fever. °· Headache. °· Fatigue. °· Abdominal pain. °DIAGNOSIS  °Your caregiver can usually diagnose viral gastroenteritis based on your symptoms and a physical exam. A stool sample may also be taken to test for the presence of viruses or other infections. °TREATMENT  °This illness typically goes away on its own. Treatments are aimed at rehydration. The most serious cases of viral gastroenteritis involve vomiting so severely that you are not able to keep fluids down. In these cases, fluids must be given through an intravenous line (IV). °HOME CARE INSTRUCTIONS  °· Drink enough fluids to keep your urine clear or pale yellow. Drink small amounts of fluids frequently and increase the amounts as tolerated. °· Ask your caregiver for specific rehydration instructions. °· Avoid: °¨ Foods high in sugar. °¨ Alcohol. °¨ Carbonated drinks. °¨ Tobacco. °¨ Juice. °¨ Caffeine drinks. °¨ Extremely hot or cold fluids. °¨ Fatty, greasy foods. °¨ Too much intake of anything at one time. °¨ Dairy products until 24 to 48 hours after diarrhea stops. °· You may consume probiotics.  Probiotics are active cultures of beneficial bacteria. They may lessen the amount and number of diarrheal stools in adults. Probiotics can be found in yogurt with active cultures and in supplements. °· Wash your hands well to avoid spreading the virus. °· Only take over-the-counter or prescription medicines for pain, discomfort, or fever as directed by your caregiver. Do not give aspirin to children. Antidiarrheal medicines are not recommended. °· Ask your caregiver if you should continue to take your regular prescribed and over-the-counter medicines. °· Keep all follow-up appointments as directed by your caregiver. °SEEK IMMEDIATE MEDICAL CARE IF:  °· You are unable to keep fluids down. °· You do not urinate at least once every 6 to 8 hours. °· You develop shortness of breath. °· You notice blood in your stool or vomit. This may look like coffee grounds. °· You have abdominal pain that increases or is concentrated in one small area (localized). °· You have persistent vomiting or diarrhea. °· You have a fever. °· The patient is a child younger than 3 months, and he or she has a fever. °· The patient is a child older than 3 months, and he or she has a fever and persistent symptoms. °· The patient is a child older than 3 months, and he or she has a fever and symptoms suddenly get worse. °· The patient is a baby, and he or she has no tears when crying. °MAKE SURE YOU:  °· Understand these instructions. °· Will watch your condition. °· Will get help right away if you are not doing well or get worse. °  °This information is not intended to replace   advice given to you by your health care provider. Make sure you discuss any questions you have with your health care provider.   Document Released: 09/17/2005 Document Revised: 12/10/2011 Document Reviewed: 07/04/2011 Elsevier Interactive Patient Education 2016 [employer] Viral Infections A virus is a type of germ. Viruses can cause:  Minor sore throats.  Aches and  pains.  Headaches.  Runny nose.  Rashes.  Watery eyes.  Tiredness.  Coughs.  Loss of appetite.  Feeling sick to your stomach (nausea).  Throwing up (vomiting).  Watery poop (diarrhea). HOME CARE   Only take medicines as told by your doctor.  Drink enough water and fluids to keep your pee (urine) clear or pale yellow. Sports drinks are a good choice.  Get plenty of rest and eat healthy. Soups and broths with crackers or rice are fine. GET HELP RIGHT AWAY IF:   You have a very bad headache.  You have shortness of breath.  You have chest pain or neck pain.  You have an unusual rash.  You cannot stop throwing up.  You have watery poop that does not stop.  You cannot keep fluids down.  You or your child has a temperature by mouth above 102 F (38.9 C), not controlled by medicine.  Your baby is older than 3 months with a rectal temperature of 102 F (38.9 C) or higher.  Your baby is 58 months old or younger with a rectal temperature of 100.4 F (38 C) or higher. MAKE SURE YOU:   Understand these instructions.  Will watch this condition.  Will get help right away if you are not doing well or get worse.   This information is not intended to replace advice given to you by your health care provider. Make sure you discuss any questions you have with your health care provider.   Document Released: 08/30/2008 Document Revised: 12/10/2011 Document Reviewed: 02/23/2015 Elsevier Interactive Patient Education 2016 [employer] Cellulitis Cellulitis is an infection of the skin and the tissue beneath it. The infected area is usually red and tender. Cellulitis occurs most often in the arms and lower legs.  CAUSES  Cellulitis is caused by bacteria that enter the skin through cracks or cuts in the skin. The most common types of bacteria that cause cellulitis are staphylococci and streptococci. SIGNS AND SYMPTOMS   Redness and warmth.  Swelling.  Tenderness or  pain.  Fever. DIAGNOSIS  Your health care provider can usually determine what is wrong based on a physical exam. Blood tests may also be done. TREATMENT  Treatment usually involves taking an antibiotic medicine. HOME CARE INSTRUCTIONS   Take your antibiotic medicine as directed by your health care provider. Finish the antibiotic even if you start to feel better.  Keep the infected arm or leg elevated to reduce swelling.  Apply a warm cloth to the affected area up to 4 times per day to relieve pain.  Take medicines only as directed by your health care provider.  Keep all follow-up visits as directed by your health care provider. SEEK MEDICAL CARE IF:   You notice red streaks coming from the infected area.  Your red area gets larger or turns dark in color.  Your bone or joint underneath the infected area becomes painful after the skin has healed.  Your infection returns in the same area or another area.  You notice a swollen bump in the infected area.  You develop new symptoms.  You have a fever. [place]  IF:   You feel very sleepy.  You develop vomiting or diarrhea.  You have a general ill feeling (malaise) with muscle aches and pains.   This information is not intended to replace advice given to you by your health care provider. Make sure you discuss any questions you have with your health care provider.   Document Released: 06/27/2005 Document Revised: 06/08/2015 Document Reviewed: 12/03/2011 Elsevier Interactive Patient Education 2016 [employer] Pulmonary Embolism A pulmonary embolism (PE) is a sudden blockage or decrease of blood flow in one lung or both lungs. Most blockages come from a blood clot that travels from the legs or the pelvis to the lungs. PE is a dangerous and potentially life-threatening condition if it is not treated right away. CAUSES A pulmonary embolism occurs most commonly when a blood clot travels from one of your veins to  your lungs. Rarely, PE is caused by air, fat, amniotic fluid, or part of a tumor traveling through your veins to your lungs. RISK FACTORS A PE is more likely to develop in:  People who smoke.  People who areolder, especially over 109 years of age.  People who are overweight (obese).  People who sit or lie still for a long time, such as during long-distance travel (over 4 hours), bed rest, hospitalization, or during recovery from certain medical conditions like a stroke.  People who do not engage in much physical activity (sedentary lifestyle).  People who have chronic breathing disorders.  People whohave a personal or family history of blood clots or blood clotting disease.  People whohave peripheral vascular disease (PVD), diabetes, or some types of cancer.  People who haveheart disease, especially if the person had a recent heart attack or has congestive heart failure.  People who have neurological diseases that affect the legs (leg paresis).  People who have had a traumatic injury, such as breaking a hip or leg.  People whohave recently had major or lengthy surgery, especially on the hip, knee, or abdomen.  People who have hada central line placed inside a large vein.  People who takemedicines that contain the hormone estrogen. These include birth control pills and hormone replacement therapy.  Pregnancy or during childbirth or the postpartum period. SIGNS AND SYMPTOMS  The symptoms of a PE usually start suddenly and include:  Shortness of breath while active or at rest.  Coughing or coughing up blood or blood-tinged mucus.  Chest pain that is often worse with deep breaths.  Rapid or irregular heartbeat.  Feeling light-headed or dizzy.  Fainting.  Feelinganxious.  Sweating. There may also be pain and swelling in a leg if that is where the blood clot started. These symptoms may represent a serious problem that is an emergency. Do not wait to see if the  symptoms will go away. Get medical help right away. Call your local emergency services (911 in the [place] Do not drive yourself to the hospital. DIAGNOSIS Your health care provider will take a medical history and perform a physical exam. You may also have other tests, including:  Blood tests to assess the clotting properties of your blood, assess oxygen levels in your blood, and find blood clots.  Imaging tests, such as CT, ultrasound, MRI, X-ray, and other tests to see if you have clots anywhere in your body.  An electrocardiogram (ECG) to look for heart strain from blood clots in the lungs. TREATMENT The main goals of PE treatment are:  To stop a blood clot from growing larger.  To  stop new blood clots from forming. The type of treatment that you receive depends on many factors, such as the cause of your PE, your risk for bleeding or developing more clots, and other medical conditions that you have. Sometimes, a combination of treatments is necessary. This condition may be treated with:  Medicines, including newer oral blood thinners (anticoagulants), warfarin, low molecular weight heparins, thrombolytics, or heparins.  Wearing compression stockings or using different types of devices.  Surgery (rare) to remove the blood clot or to place a filter in your abdomen to stop the blood clot from traveling to your lungs. Treatments for a PE are often divided into immediate treatment, long-term treatment (up to 3 months after PE), and extended treatment (more than 3 months after PE). Your treatment may continue for several months. This is called maintenance therapy, and it is used to prevent the forming of new blood clots. You can work with your health care provider to choose the treatment program that is best for you. What are anticoagulants? Anticoagulants are medicines that treat PEs. They can stop current blood clots from growing and stop new clots from forming. They cannot dissolve existing  clots. Your body dissolves clots by itself over time. Anticoagulants are given by mouth, by injection, or through an IV tube. What are thrombolytics? Thrombolytics are clot-dissolving medicines that are used to dissolve a PE. They carry a high risk of bleeding, so they tend to be used only in severe cases or if you have very low blood pressure. HOME CARE INSTRUCTIONS If you are taking a newer oral anticoagulant:  Take the medicine every single day at the same time each day.  Understand what foods and drugs interact with this medicine.  Understand that there are no regular blood tests required when using this medicine.  Understandthe side effects of this medicine, including excessive bruising or bleeding. Ask your health care provider or pharmacist about other possible side effects. If you are taking warfarin:  Understand how to take warfarin and know which foods can affect how warfarin works in Veterinary surgeon.  Understand that it is dangerous to taketoo much or too little warfarin. Too much warfarin increases the risk of bleeding. Too little warfarin continues to allow the risk for blood clots.  Follow your PT and INR blood testing schedule. The PT and INR results allow your health care provider to adjust your dose of warfarin. It is very important that you have your PT and INR tested as often as told by your health care provider.  Avoid major changes in your diet, or tell your health care provider before you change your diet. Arrange a visit with a registered dietitian to answer your questions. Many foods, especially foods that are high in vitamin K, can interfere with warfarin and affect the PT and INR results. Eat a consistent amount of foods that are high in vitamin K, such as:  Spinach, kale, broccoli, cabbage, collard greens, turnip greens, Brussels sprouts, peas, cauliflower, seaweed, and parsley.  Beef liver and pork liver.  Green tea.  Soybean oil.  Tell your health care  provider about any and all medicines, vitamins, and supplements that you take, including aspirin and other over-the-counter anti-inflammatory medicines. Be especially cautious with aspirin and anti-inflammatory medicines. Do not take those before you ask your health care provider if it is safe to do so. This is important because many medicines can interfere with warfarin and affect the PT and INR results.  Do not start or stop  taking any over-the-counter or prescription medicine unless your health care provider or pharmacist tells you to do so. If you take warfarin, you will also need to do these things:  Hold pressure over cuts for longer than usual.  Tell your dentist and other health care providers that you are taking warfarin before you have any procedures in which bleeding may occur.  Avoid alcohol or drink very small amounts. Tell your health care provider if you change your alcohol intake.  Do not use tobacco products, including cigarettes, chewing tobacco, and e-cigarettes. If you need help quitting, ask your health care provider.  Avoid contact sports. General Instructions  Take over-the-counter and prescription medicines only as told by your health care provider. Anticoagulant medicines can have side effects, including easy bruising and difficulty stopping bleeding. If you are prescribed an anticoagulant, you will also need to do these things:  Hold pressure over cuts for longer than usual.  Tell your dentist and other health care providers that you are taking anticoagulants before you have any procedures in which bleeding may occur.  Avoid contact sports.  Wear a medical alert bracelet or carry a medical alert card that says you have had a PE.  Ask your health care provider how soon you can go back to your normal activities. Stay active to prevent new blood clots from forming.  Make sure to exercise while traveling or when you have been sitting or standing for a long period of  time. It is very important to exercise. Exercise your legs by walking or by tightening and relaxing your leg muscles often. Take frequent walks.  Wear compression stockings as told by your health care provider to help prevent more blood clots from forming.  Do not use tobacco products, including cigarettes, chewing tobacco, and e-cigarettes. If you need help quitting, ask your health care provider.  Keep all follow-up appointments with your health care provider. This is important. PREVENTION Take these actions to decrease your risk of developing another PE:  Exercise regularly. For at least 30 minutes every day, engage in:  Activity that involves moving your arms and legs.  Activity that encourages good blood flow through your body by increasing your heart rate.  Exercise your arms and legs every hour during long-distance travel (over 4 hours). Drink plenty of water and avoid drinking alcohol while traveling.  Avoid sitting or lying in bed for long periods of time without moving your legs.  Maintain a weight that is appropriate for your height. Ask your health care provider what weight is healthy for you.  If you are a woman who is over 7 years of age, avoid unnecessary use of medicines that contain estrogen. These include birth control pills.  Do not smoke, especially if you take estrogen medicines. If you need help quitting, ask your health care provider.  If you are at very high risk for PE, wear compression stockings.  If you recently had a PE, have regularly scheduled ultrasound testing on your legs to check for new blood clots. If you are hospitalized, prevention measures may include:  Early walking after surgery, as soon as your health care provider says that it is safe.  Receiving anticoagulants to prevent blood clots. If you cannot take anticoagulants, other options may be available, such as wearing compression stockings or using different types of devices. SEEK IMMEDIATE  MEDICAL CARE IF:  You have new or increased pain, swelling, or redness in an arm or leg.  You have numbness or tingling  in an arm or leg.  You have shortness of breath while active or at rest.  You have chest pain.  You have a rapid or irregular heartbeat.  You feel light-headed or dizzy.  You cough up blood.  You notice blood in your vomit, bowel movement, or urine.  You have a fever. These symptoms may represent a serious problem that is an emergency. Do not wait to see if the symptoms will go away. Get medical help right away. Call your local emergency services (911 in the [place] Do not drive yourself to the hospital.   This information is not intended to replace advice given to you by your health care provider. Make sure you discuss any questions you have with your health care provider.   Document Released: 09/14/2000 Document Revised: 06/08/2015 Document Reviewed: 01/12/2015 Elsevier Interactive Patient Education 2016 [place] A contusion is a deep bruise. Contusions are the result of a blunt injury to tissues and muscle fibers under the skin. The injury causes bleeding under the skin. The skin overlying the contusion may turn blue, purple, or yellow. Minor injuries will give you a painless contusion, but more severe contusions may stay painful and swollen for a few weeks.  CAUSES  This condition is usually caused by a blow, trauma, or direct force to an area of the body. SYMPTOMS  Symptoms of this condition include:  Swelling of the injured area.  Pain and tenderness in the injured area.  Discoloration. The area may have redness and then turn blue, purple, or yellow. DIAGNOSIS  This condition is diagnosed based on a physical exam and medical history. An X-ray, CT scan, or MRI may be needed to determine if there are any associated injuries, such as broken bones (fractures). TREATMENT  Specific treatment for this condition depends on what area of the body  was injured. In general, the best treatment for a contusion is resting, icing, applying pressure to (compression), and elevating the injured area. This is often called the RICE strategy. Over-the-counter anti-inflammatory medicines may also be recommended for pain control.  HOME CARE INSTRUCTIONS   Rest the injured area.  If directed, apply ice to the injured area:  Put ice in a plastic bag.  Place a towel between your skin and the bag.  Leave the ice on for 20 minutes, 2-3 times per day.  If directed, apply light compression to the injured area using an elastic bandage. Make sure the bandage is not wrapped too tightly. Remove and reapply the bandage as directed by your health care provider.  If possible, raise (elevate) the injured area above the level of your heart while you are sitting or lying down.  Take over-the-counter and prescription medicines only as told by your health care provider. SEEK MEDICAL CARE IF:  Your symptoms do not improve after several days of treatment.  Your symptoms get worse.  You have difficulty moving the injured area. SEEK IMMEDIATE MEDICAL CARE IF:   You have severe pain.  You have numbness in a hand or foot.  Your hand or foot turns pale or cold.   This information is not intended to replace advice given to you by your health care provider. Make sure you discuss any questions you have with your health care provider.   Document Released: 06/27/2005 Document Revised: 06/08/2015 Document Reviewed: 02/02/2015 Elsevier Interactive Patient Education 2016 [employer] Hematoma A hematoma is a collection of blood under the skin, in an organ, in a body space, in a  joint space, or in other tissue. The blood can clot to form a lump that you can see and feel. The lump is often firm and may sometimes become sore and tender. Most hematomas get better in a few days to weeks. However, some hematomas may be serious and require medical care. Hematomas can range  in size from very small to very large. CAUSES  A hematoma can be caused by a blunt or penetrating injury. It can also be caused by spontaneous leakage from a blood vessel under the skin. Spontaneous leakage from a blood vessel is more likely to occur in older people, especially those taking blood thinners. Sometimes, a hematoma can develop after certain medical procedures. SIGNS AND SYMPTOMS   A firm lump on the body.  Possible pain and tenderness in the area.  Bruising.Blue, dark blue, purple-red, or yellowish skin may appear at the site of the hematoma if the hematoma is close to the surface of the skin. For hematomas in deeper tissues or body spaces, the signs and symptoms may be subtle. For example, an intra-abdominal hematoma may cause abdominal pain, weakness, fainting, and shortness of breath. An intracranial hematoma may cause a headache or symptoms such as weakness, trouble speaking, or a change in consciousness. DIAGNOSIS  A hematoma can usually be diagnosed based on your medical history and a physical exam. Imaging tests may be needed if your health care provider suspects a hematoma in deeper tissues or body spaces, such as the abdomen, head, or chest. These tests may include ultrasonography or a CT scan.  TREATMENT  Hematomas usually go away on their own over time. Rarely does the blood need to be drained out of the body. Large hematomas or those that may affect vital organs will sometimes need surgical drainage or monitoring. HOME CARE INSTRUCTIONS   Apply ice to the injured area:   Put ice in a plastic bag.   Place a towel between your skin and the bag.   Leave the ice on for 20 minutes, 2-3 times a day for the first 1 to 2 days.   After the first 2 days, switch to using warm compresses on the hematoma.   Elevate the injured area to help decrease pain and swelling. Wrapping the area with an elastic bandage may also be helpful. Compression helps to reduce swelling and  promotes shrinking of the hematoma. Make sure the bandage is not wrapped too tight.   If your hematoma is on a lower extremity and is painful, crutches may be helpful for a couple days.   Only take over-the-counter or prescription medicines as directed by your health care provider. SEEK IMMEDIATE MEDICAL CARE IF:   You have increasing pain, or your pain is not controlled with medicine.   You have a fever.   You have worsening swelling or discoloration.   Your skin over the hematoma breaks or starts bleeding.   Your hematoma is in your chest or abdomen and you have weakness, shortness of breath, or a change in consciousness.  Your hematoma is on your scalp (caused by a fall or injury) and you have a worsening headache or a change in alertness or consciousness. MAKE SURE YOU:   Understand these instructions.  Will watch your condition.  Will get help right away if you are not doing well or get worse.   This information is not intended to replace advice given to you by your health care provider. Make sure you discuss any questions you have with your  health care provider.   Document Released: 05/01/2004 Document Revised: 05/20/2013 Document Reviewed: 02/25/2013 Elsevier Interactive Patient Education [employer].

## 2016-02-01 NOTE — ED Notes (Signed)
Patient states that he was hit by a softball to his left lower leg a week ago.  Patient has redness, swelling and tenderness at the site on his left lower leg.  Patient also reports a cough that started 2 days.   Patient reports he had a fever last night.

## 2016-02-15 DIAGNOSIS — N5201 Erectile dysfunction due to arterial insufficiency: Secondary | ICD-10-CM | POA: Diagnosis not present

## 2016-02-15 DIAGNOSIS — R3915 Urgency of urination: Secondary | ICD-10-CM | POA: Diagnosis not present

## 2016-02-15 DIAGNOSIS — C61 Malignant neoplasm of prostate: Secondary | ICD-10-CM | POA: Diagnosis not present

## 2016-02-15 DIAGNOSIS — D400 Neoplasm of uncertain behavior of prostate: Secondary | ICD-10-CM | POA: Diagnosis not present

## 2016-02-23 DIAGNOSIS — C61 Malignant neoplasm of prostate: Secondary | ICD-10-CM | POA: Diagnosis not present

## 2016-02-23 DIAGNOSIS — R972 Elevated prostate specific antigen [PSA]: Secondary | ICD-10-CM | POA: Diagnosis not present

## 2016-02-23 DIAGNOSIS — D400 Neoplasm of uncertain behavior of prostate: Secondary | ICD-10-CM | POA: Diagnosis not present

## 2016-03-08 DIAGNOSIS — D400 Neoplasm of uncertain behavior of prostate: Secondary | ICD-10-CM | POA: Diagnosis not present

## 2016-03-08 DIAGNOSIS — C61 Malignant neoplasm of prostate: Secondary | ICD-10-CM | POA: Diagnosis not present

## 2016-03-08 DIAGNOSIS — R972 Elevated prostate specific antigen [PSA]: Secondary | ICD-10-CM | POA: Diagnosis not present

## 2016-03-12 DIAGNOSIS — K219 Gastro-esophageal reflux disease without esophagitis: Secondary | ICD-10-CM | POA: Diagnosis not present

## 2016-03-12 DIAGNOSIS — Z8546 Personal history of malignant neoplasm of prostate: Secondary | ICD-10-CM | POA: Diagnosis not present

## 2016-03-12 DIAGNOSIS — I10 Essential (primary) hypertension: Secondary | ICD-10-CM | POA: Diagnosis not present

## 2016-03-12 DIAGNOSIS — I639 Cerebral infarction, unspecified: Secondary | ICD-10-CM | POA: Diagnosis not present

## 2016-03-19 DIAGNOSIS — D400 Neoplasm of uncertain behavior of prostate: Secondary | ICD-10-CM | POA: Diagnosis not present

## 2016-03-19 DIAGNOSIS — I639 Cerebral infarction, unspecified: Secondary | ICD-10-CM | POA: Diagnosis not present

## 2016-03-19 DIAGNOSIS — K219 Gastro-esophageal reflux disease without esophagitis: Secondary | ICD-10-CM | POA: Diagnosis not present

## 2016-03-19 DIAGNOSIS — C61 Malignant neoplasm of prostate: Secondary | ICD-10-CM | POA: Diagnosis not present

## 2016-03-19 DIAGNOSIS — R05 Cough: Secondary | ICD-10-CM | POA: Diagnosis not present

## 2016-03-19 DIAGNOSIS — I10 Essential (primary) hypertension: Secondary | ICD-10-CM | POA: Diagnosis not present

## 2016-03-19 DIAGNOSIS — R972 Elevated prostate specific antigen [PSA]: Secondary | ICD-10-CM | POA: Diagnosis not present

## 2016-04-27 DIAGNOSIS — C61 Malignant neoplasm of prostate: Secondary | ICD-10-CM | POA: Diagnosis not present

## 2016-05-24 DIAGNOSIS — L570 Actinic keratosis: Secondary | ICD-10-CM | POA: Diagnosis not present

## 2016-05-24 DIAGNOSIS — Z872 Personal history of diseases of the skin and subcutaneous tissue: Secondary | ICD-10-CM | POA: Diagnosis not present

## 2016-05-24 DIAGNOSIS — Z09 Encounter for follow-up examination after completed treatment for conditions other than malignant neoplasm: Secondary | ICD-10-CM | POA: Diagnosis not present

## 2016-05-24 DIAGNOSIS — Z1283 Encounter for screening for malignant neoplasm of skin: Secondary | ICD-10-CM | POA: Diagnosis not present

## 2016-05-24 DIAGNOSIS — L723 Sebaceous cyst: Secondary | ICD-10-CM | POA: Diagnosis not present

## 2016-06-06 DIAGNOSIS — C61 Malignant neoplasm of prostate: Secondary | ICD-10-CM | POA: Diagnosis not present

## 2016-06-07 DIAGNOSIS — Z8546 Personal history of malignant neoplasm of prostate: Secondary | ICD-10-CM | POA: Diagnosis not present

## 2016-06-20 DIAGNOSIS — R972 Elevated prostate specific antigen [PSA]: Secondary | ICD-10-CM | POA: Diagnosis not present

## 2016-06-20 DIAGNOSIS — N5201 Erectile dysfunction due to arterial insufficiency: Secondary | ICD-10-CM | POA: Diagnosis not present

## 2016-06-20 DIAGNOSIS — C61 Malignant neoplasm of prostate: Secondary | ICD-10-CM | POA: Diagnosis not present

## 2016-06-20 DIAGNOSIS — N401 Enlarged prostate with lower urinary tract symptoms: Secondary | ICD-10-CM | POA: Diagnosis not present

## 2016-06-20 DIAGNOSIS — D400 Neoplasm of uncertain behavior of prostate: Secondary | ICD-10-CM | POA: Diagnosis not present

## 2016-08-02 DIAGNOSIS — Z23 Encounter for immunization: Secondary | ICD-10-CM | POA: Diagnosis not present

## 2016-08-08 DIAGNOSIS — Z8546 Personal history of malignant neoplasm of prostate: Secondary | ICD-10-CM | POA: Diagnosis not present

## 2016-09-12 DIAGNOSIS — I639 Cerebral infarction, unspecified: Secondary | ICD-10-CM | POA: Diagnosis not present

## 2016-09-12 DIAGNOSIS — K219 Gastro-esophageal reflux disease without esophagitis: Secondary | ICD-10-CM | POA: Diagnosis not present

## 2016-09-12 DIAGNOSIS — I10 Essential (primary) hypertension: Secondary | ICD-10-CM | POA: Diagnosis not present

## 2016-09-12 DIAGNOSIS — R05 Cough: Secondary | ICD-10-CM | POA: Diagnosis not present

## 2016-09-17 DIAGNOSIS — R351 Nocturia: Secondary | ICD-10-CM | POA: Diagnosis not present

## 2016-09-17 DIAGNOSIS — R972 Elevated prostate specific antigen [PSA]: Secondary | ICD-10-CM | POA: Diagnosis not present

## 2016-09-17 DIAGNOSIS — D400 Neoplasm of uncertain behavior of prostate: Secondary | ICD-10-CM | POA: Diagnosis not present

## 2016-09-17 DIAGNOSIS — C61 Malignant neoplasm of prostate: Secondary | ICD-10-CM | POA: Diagnosis not present

## 2016-09-19 DIAGNOSIS — I639 Cerebral infarction, unspecified: Secondary | ICD-10-CM | POA: Diagnosis not present

## 2016-09-19 DIAGNOSIS — K219 Gastro-esophageal reflux disease without esophagitis: Secondary | ICD-10-CM | POA: Diagnosis not present

## 2016-09-19 DIAGNOSIS — Z0000 Encounter for general adult medical examination without abnormal findings: Secondary | ICD-10-CM | POA: Diagnosis not present

## 2016-09-19 DIAGNOSIS — I10 Essential (primary) hypertension: Secondary | ICD-10-CM | POA: Diagnosis not present

## 2016-09-19 DIAGNOSIS — Z8546 Personal history of malignant neoplasm of prostate: Secondary | ICD-10-CM | POA: Diagnosis not present

## 2016-10-29 DIAGNOSIS — L853 Xerosis cutis: Secondary | ICD-10-CM | POA: Diagnosis not present

## 2016-10-29 DIAGNOSIS — L720 Epidermal cyst: Secondary | ICD-10-CM | POA: Diagnosis not present

## 2016-11-26 DIAGNOSIS — Z1211 Encounter for screening for malignant neoplasm of colon: Secondary | ICD-10-CM | POA: Diagnosis not present

## 2016-11-26 DIAGNOSIS — Z1212 Encounter for screening for malignant neoplasm of rectum: Secondary | ICD-10-CM | POA: Diagnosis not present

## 2016-12-06 DIAGNOSIS — C61 Malignant neoplasm of prostate: Secondary | ICD-10-CM | POA: Diagnosis not present

## 2017-01-23 ENCOUNTER — Ambulatory Visit
Admission: EM | Admit: 2017-01-23 | Discharge: 2017-01-23 | Disposition: A | Discharge status: Home / Self Care | Payer: Medicare Other | Attending: Family Medicine | Admitting: Family Medicine

## 2017-01-23 ENCOUNTER — Encounter: Payer: Self-pay | Admitting: *Deleted

## 2017-01-23 DIAGNOSIS — J069 Acute upper respiratory infection, unspecified: Secondary | ICD-10-CM | POA: Diagnosis not present

## 2017-01-23 DIAGNOSIS — R05 Cough: Secondary | ICD-10-CM | POA: Diagnosis not present

## 2017-01-23 MED ORDER — ALBUTEROL SULFATE HFA 108 (90 BASE) MCG/ACT IN AERS: 2.0000 | INHALATION_SPRAY | RESPIRATORY_TRACT | 1 refills | Status: DC | PRN

## 2017-01-23 MED ORDER — AZITHROMYCIN 250 MG PO TABS: ORAL_TABLET | ORAL | 0 refills | Status: DC

## 2017-01-23 MED ORDER — HYDROCOD POLST-CPM POLST ER 10-8 MG/5ML PO SUER: 5.0000 mL | Freq: Two times a day (BID) | ORAL | 0 refills | Status: DC | PRN

## 2017-01-23 NOTE — ED Triage Notes (Signed)
Productive cough- clear, chills, body aches, x1 week, onset of fever today 100.4.

## 2017-01-23 NOTE — ED Provider Notes (Signed)
[facility]    CSN: [id number] Arrival date & time: 01/23/17  1033     History   Chief Complaint Chief Complaint  Patient presents with  . Cough  . Generalized Body Aches  . Chills  . Fever    HPI Darryl Roman is a 83 y.o. male.   Patient is a 83 year old white male with some difficulty hearing comes in with his wife because of cough and congestion. This started about a week ago. His wife states that he's been active over the last several days he played in the special saw [employer] treatment over the weekend basically he doesn't hearing and his age group. He also has some tobacco on Monday but she states that the cough and congestion they thought was just coming from a cold in goingonforaboutaweekuntiltodaywhenhestartedrunningafeverandcoughingupmorethickmucus.Hasnotyellow-greenbutitismorethatithasbeen.Hehasahistoryofhypertensionwhichhiswifestatesthatsincehercontrolmedication.HeneversmokednohistoryCOPDemphysemaasthma.Longwithhistoryhypertensionhe'salsohadkidneystones. He has difficulty hearing. No pertinent family medical history no known drug allergies. Only surgery was shoulder surgery.   The history is provided by the patient. No language interpreter was used.  Cough  Cough characteristics:  Productive Sputum characteristics:  Nondescript Severity:  Moderate Timing:  Constant Progression:  Worsening Chronicity:  New Smoker: no   Context: sick contacts and upper respiratory infection   Relieved by:  Nothing Ineffective treatments:  None tried Associated symptoms: fever   Fever:    Duration:  6 hours   Timing:  Intermittent   Max temp PTA:  100 Risk factors: recent infection   Risk factors: no chemical exposure and no recent travel   Fever  Associated symptoms: cough     Past Medical History:  Diagnosis Date  . Hypertension   . Kidney stones   . Stroke [facility])     There are no active problems to display for this patient.   Past Surgical History:    Procedure Laterality Date  . SHOULDER SURGERY         Home Medications    Prior to Admission medications   Medication Sig Start Date End Date Taking? Authorizing Provider  amLODipine (NORVASC) 10 MG tablet Take 10 mg by mouth daily.   Yes Historical Provider, MD  aspirin EC 325 MG tablet Take 325 mg by mouth daily.   Yes Historical Provider, MD  atorvastatin (LIPITOR) 10 MG tablet Take 10 mg by mouth daily.   Yes Historical Provider, MD  losartan (COZAAR) 25 MG tablet Take 25 mg by mouth daily.   Yes Historical Provider, MD  Misc Natural Products (OSTEO BI-FLEX ADV DOUBLE ST PO) Take 1 tablet by mouth 2 (two) times daily.   Yes Historical Provider, MD  vitamin B-12 (CYANOCOBALAMIN) 1000 MCG tablet Take 1,000 mcg by mouth daily.   Yes Historical Provider, MD  acetaminophen (TYLENOL) 500 MG tablet Take 2 tablets (1,000 mg total) by mouth every 6 (six) hours as needed for moderate pain or fever. 02/01/16   Olen Cordial, NP  albuterol (PROVENTIL HFA;VENTOLIN HFA) 108 (90 Base) MCG/ACT inhaler Inhale 2 puffs into the lungs every 4 (four) hours as needed for wheezing or shortness of breath. 01/23/17   Frederich Cha, MD  aspirin 81 MG tablet Take 81 mg by mouth daily.    Historical Provider, MD  azithromycin (ZITHROMAX Z-PAK) 250 MG tablet Take 2 tablets first day and then 1 po a day for 4 days 01/23/17   Frederich Cha, MD  chlorpheniramine-HYDROcodone [facility] PENNKINETIC ER) 10-8 MG/5ML SUER Take 5 mLs by mouth every 12 (twelve) hours as needed. 01/23/17   Frederich Cha, MD    Family History History  reviewed. No pertinent family history.  Social History Social History  Substance Use Topics  . Smoking status: Never Smoker  . Smokeless tobacco: Never Used  . Alcohol use No     Allergies   Patient has no known allergies.   Review of Systems Review of Systems  Constitutional: Positive for fever.  Respiratory: Positive for cough.   All other systems reviewed and are  negative.    Physical Exam Triage Vital Signs ED Triage Vitals  Enc Vitals Group     BP 01/23/17 1100 133/69     Pulse Rate 01/23/17 1100 78     Resp 01/23/17 1100 16     Temp 01/23/17 1100 98.7 F (37.1 C)     Temp Source 01/23/17 1100 Oral     SpO2 01/23/17 1100 95 %     Weight 01/23/17 1102 195 lb (88.5 kg)     Height 01/23/17 1102 5\' 9"  (1.753 m)     Head Circumference --      Peak Flow --      Pain Score --      Pain Loc --      Pain Edu? --      Excl. in [place]? --    No data found.   Updated Vital Signs BP 133/69 (BP Location: Left Arm)   Pulse 78   Temp 98.7 F (37.1 C) (Oral)   Resp 16   Ht 5\' 9"  (1.753 m)   Wt 195 lb (88.5 kg)   SpO2 95%   BMI 28.80 kg/m   Visual Acuity Right Eye Distance:   Left Eye Distance:   Bilateral Distance:    Right Eye Near:   Left Eye Near:    Bilateral Near:     Physical Exam  Constitutional: He is oriented to person, place, and time. He appears well-developed.  HENT:  Head: Normocephalic.  Eyes: EOM are normal. Pupils are equal, round, and reactive to light.  Neck: Normal range of motion. Neck supple.  Cardiovascular: Normal rate and regular rhythm.   Pulmonary/Chest: Effort normal. He has rhonchi.  Abdominal: Soft.  Musculoskeletal: Normal range of motion. He exhibits no edema.  Neurological: He is alert and oriented to person, place, and time.  Skin: Skin is warm.  Psychiatric: He has a normal mood and affect.  Vitals reviewed.    UC Treatments / Results  Labs (all labs ordered are listed, but only abnormal results are displayed) Labs Reviewed - No data to display  EKG  EKG Interpretation None       Radiology No results found.  Procedures Procedures (including critical care time)  Medications Ordered in UC Medications - No data to display   Initial Impression / Assessment and Plan / UC Course  I have reviewed the triage vital signs and the nursing notes.  Pertinent labs & imaging results  that were available during my care of the patient were reviewed by me and considered in my medical decision making (see chart for details).     We'll place him on Tussionex 1/2-1 teaspoon twice a day Z-Pak and will place on albuterol inhaler to use on a when necessary basis. Please follow-up with PCP in 1-2 weeks not better.  Final Clinical Impressions(s) / UC Diagnoses   Final diagnoses:  Upper respiratory tract infection, unspecified type    New Prescriptions Discharge Medication List as of 01/23/2017 11:35 AM    START taking these medications   Details  albuterol (PROVENTIL HFA;VENTOLIN HFA) 108 (90  Base) MCG/ACT inhaler Inhale 2 puffs into the lungs every 4 (four) hours as needed for wheezing or shortness of breath., Starting Wed 01/23/2017, Print    azithromycin (ZITHROMAX Z-PAK) 250 MG tablet Take 2 tablets first day and then 1 po a day for 4 days, Print    chlorpheniramine-HYDROcodone (TUSSIONEX PENNKINETIC ER) 10-8 MG/5ML SUER Take 5 mLs by mouth every 12 (twelve) hours as needed., Starting Wed 01/23/2017, Normal        Note: This dictation was prepared with Dragon dictation along with smaller phrase technology. Any transcriptional errors that result from this process are unintentional.   Frederich Cha, MD 01/23/17 1329

## 2017-01-31 DIAGNOSIS — R05 Cough: Secondary | ICD-10-CM | POA: Diagnosis not present

## 2017-01-31 DIAGNOSIS — J209 Acute bronchitis, unspecified: Secondary | ICD-10-CM | POA: Diagnosis not present

## 2017-02-21 DIAGNOSIS — L570 Actinic keratosis: Secondary | ICD-10-CM | POA: Diagnosis not present

## 2017-02-21 DIAGNOSIS — Z872 Personal history of diseases of the skin and subcutaneous tissue: Secondary | ICD-10-CM | POA: Diagnosis not present

## 2017-03-04 DIAGNOSIS — R05 Cough: Secondary | ICD-10-CM | POA: Diagnosis not present

## 2017-03-04 DIAGNOSIS — M791 Myalgia: Secondary | ICD-10-CM | POA: Diagnosis not present

## 2017-03-06 DIAGNOSIS — H53462 Homonymous bilateral field defects, left side: Secondary | ICD-10-CM | POA: Diagnosis not present

## 2017-03-14 DIAGNOSIS — M79671 Pain in right foot: Secondary | ICD-10-CM | POA: Diagnosis not present

## 2017-03-14 DIAGNOSIS — M7989 Other specified soft tissue disorders: Secondary | ICD-10-CM | POA: Diagnosis not present

## 2017-03-18 DIAGNOSIS — I10 Essential (primary) hypertension: Secondary | ICD-10-CM | POA: Diagnosis not present

## 2017-03-18 DIAGNOSIS — K219 Gastro-esophageal reflux disease without esophagitis: Secondary | ICD-10-CM | POA: Diagnosis not present

## 2017-03-18 DIAGNOSIS — I639 Cerebral infarction, unspecified: Secondary | ICD-10-CM | POA: Diagnosis not present

## 2017-03-20 DIAGNOSIS — C61 Malignant neoplasm of prostate: Secondary | ICD-10-CM | POA: Diagnosis not present

## 2017-03-20 DIAGNOSIS — N5201 Erectile dysfunction due to arterial insufficiency: Secondary | ICD-10-CM | POA: Diagnosis not present

## 2017-03-20 DIAGNOSIS — D400 Neoplasm of uncertain behavior of prostate: Secondary | ICD-10-CM | POA: Diagnosis not present

## 2017-04-01 DIAGNOSIS — K219 Gastro-esophageal reflux disease without esophagitis: Secondary | ICD-10-CM | POA: Diagnosis not present

## 2017-04-01 DIAGNOSIS — M109 Gout, unspecified: Secondary | ICD-10-CM | POA: Diagnosis not present

## 2017-04-01 DIAGNOSIS — R05 Cough: Secondary | ICD-10-CM | POA: Diagnosis not present

## 2017-04-01 DIAGNOSIS — I10 Essential (primary) hypertension: Secondary | ICD-10-CM | POA: Diagnosis not present

## 2017-04-01 DIAGNOSIS — C61 Malignant neoplasm of prostate: Secondary | ICD-10-CM | POA: Diagnosis not present

## 2017-04-25 DIAGNOSIS — R05 Cough: Secondary | ICD-10-CM | POA: Diagnosis not present

## 2017-04-25 DIAGNOSIS — M109 Gout, unspecified: Secondary | ICD-10-CM | POA: Diagnosis not present

## 2017-04-28 IMAGING — CT CT ANGIO CHEST
1 of 2 series · 17 of 30 positions shown · IV contrast (APPLIED)
Comparison: Chest CTA -02/26/2014

CLINICAL DATA: Elevated D-dimer. Cough. History of trauma to the
left lower extremity. Evaluate for pulmonary embolism

EXAM:
CT ANGIOGRAPHY CHEST WITH CONTRAST
TECHNIQUE: Multidetector CT imaging of the chest was performed using the
standard protocol during bolus administration of intravenous
contrast. Multiplanar CT image reconstructions and MIPs were
obtained to evaluate the vascular anatomy.
CONTRAST:  100 cc Isovue 370

[Series 5: pe 1.0 thins · axial · 0.77mm/px · z∈[-670,-369]mm · 17 of 339 slices shown]
[im 19/339  lung]
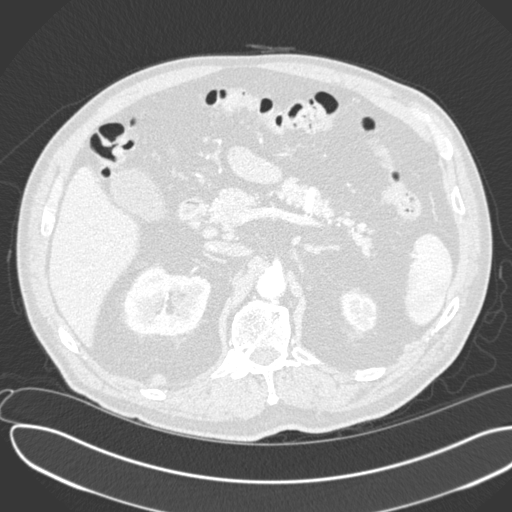
[im 38/339  mediastinal]
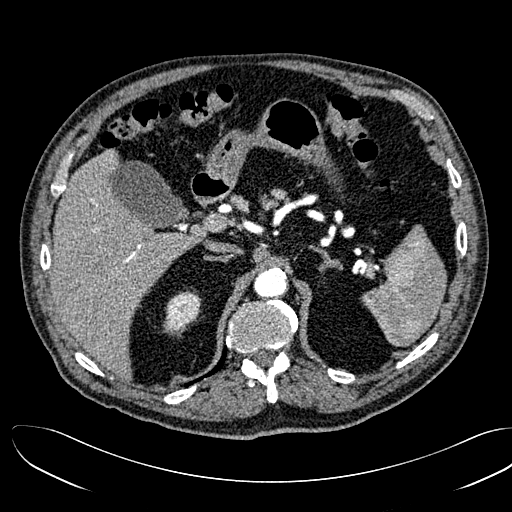
[im 57/339  lung]
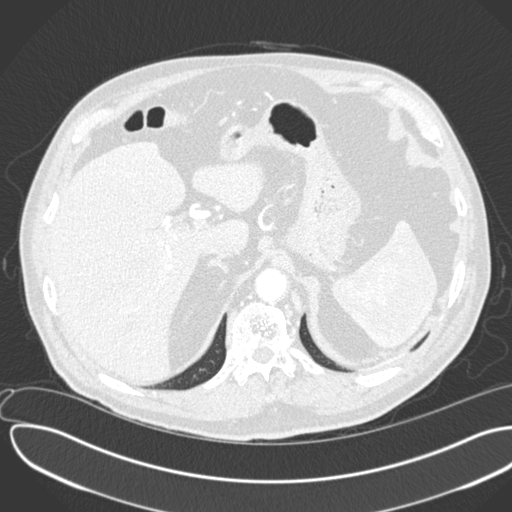
[im 76/339  mediastinal]
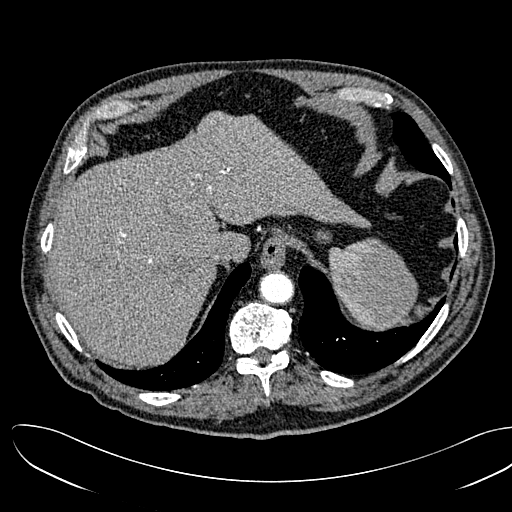
[im 94/339  lung]
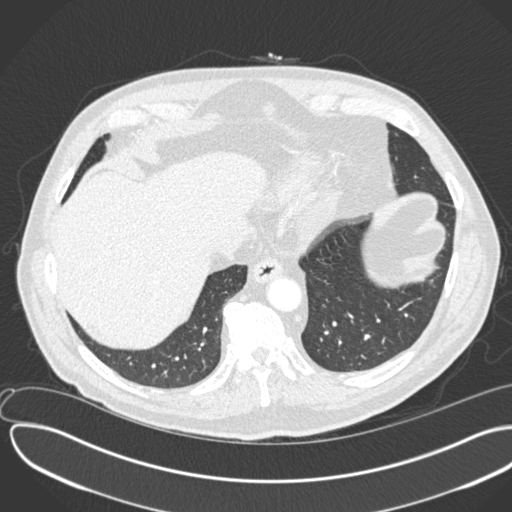
[im 113/339  mediastinal]
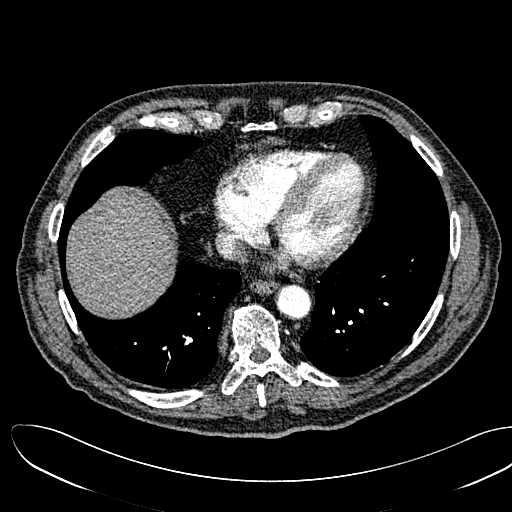
[im 132/339  lung]
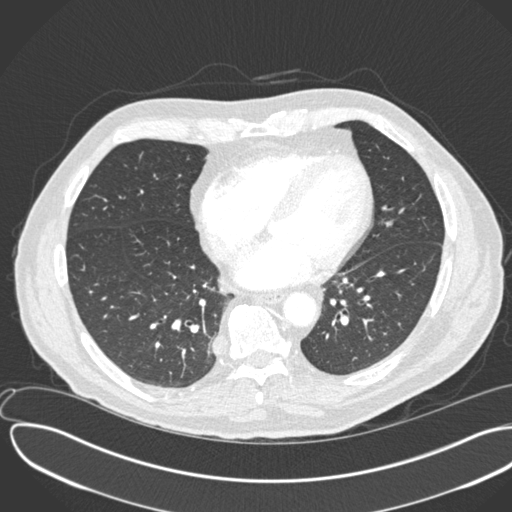
[im 151/339  mediastinal]
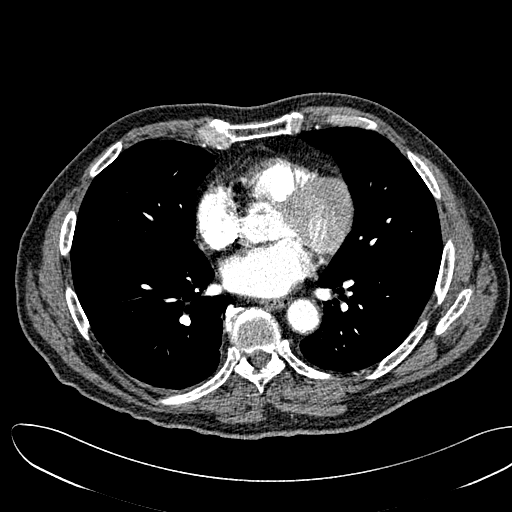
[im 170/339  lung]
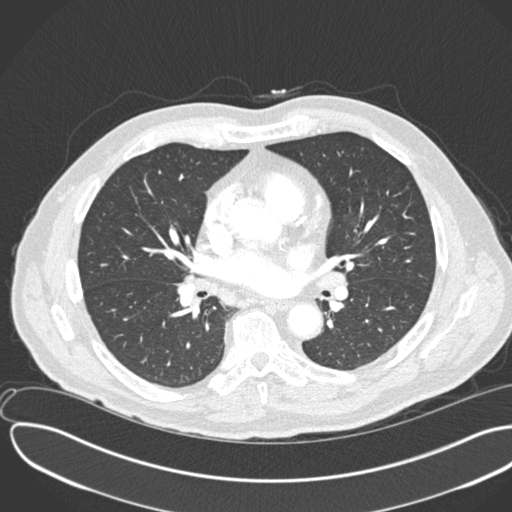
[im 188/339  mediastinal]
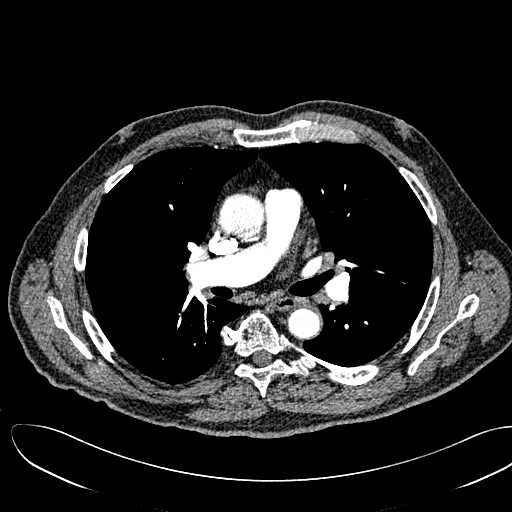
[im 207/339  lung]
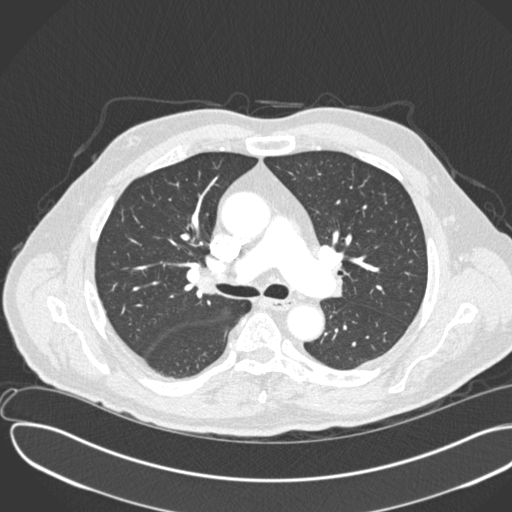
[im 226/339  mediastinal]
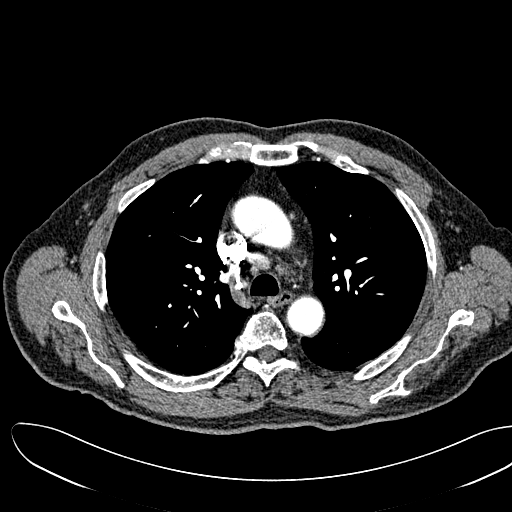
[im 245/339  lung]
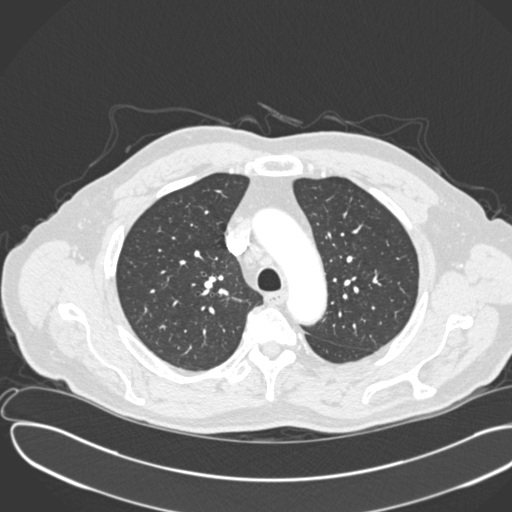
[im 263/339  mediastinal]
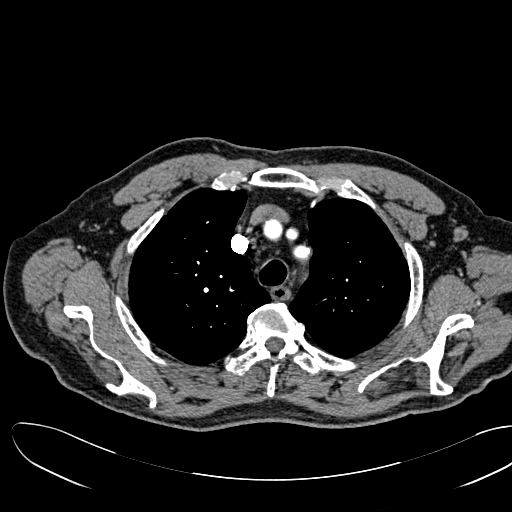
[im 282/339  lung]
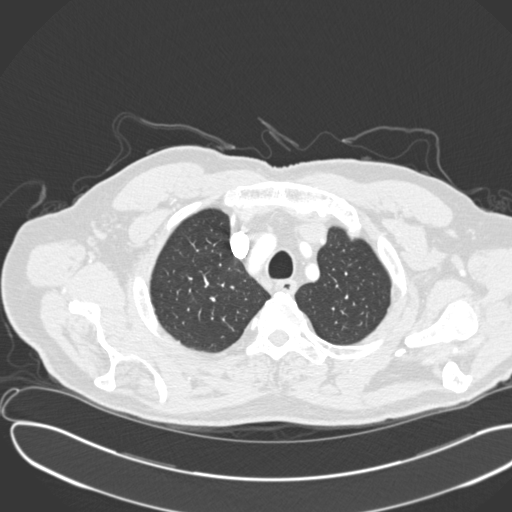
[im 301/339  mediastinal]
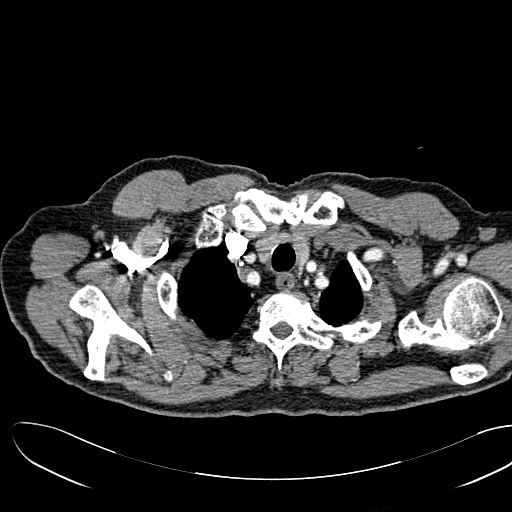
[im 320/339  lung]
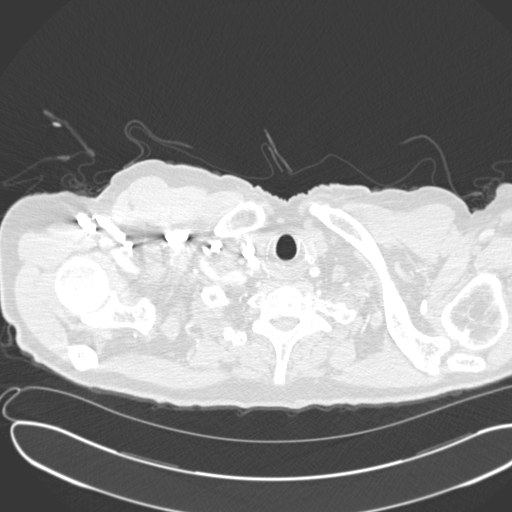

[17 of 30 positions shown; findings below may reference images not displayed]

FINDINGS: Vascular Findings:

There is adequate opacification of the pulmonary arterial system
with the main pulmonary artery measuring 366 Hounsfield units. There
are no discrete filling defects within the pulmonary arterial tree
to suggest pulmonary embolism. Normal caliber of the main pulmonary
artery.

Normal heart size. Small pericardial effusion, likely physiologic.
Coronary artery calcifications.

Moderate amount of eccentric slightly irregular mixed calcified and
noncalcified atherosclerotic plaque throughout the thoracic aorta,
not resulting in a hemodynamically significant stenosis. The
thoracic aorta is of normal caliber without evidence of thoracic
aortic dissection is nongated examination. Conventional
configuration of the aortic arch. The branch vessels of the aortic
arch are widely patent throughout their imaged course.

Review of the MIP images confirms the above findings.

----------------------------------------------------------------------------------

Nonvascular Findings:

Mediastinum/Lymph Nodes: Supraclavicular, hilar and mediastinal
lymph nodes appear similar to the 02/26/2014 examination with index
right supraclavicular lymph node measuring 0.9 cm in greatest short
axis diameter (image 9, series 4) index precarinal nodal
conglomeration measuring approximately 1.5 cm (image 60, series 4),
index right suprahilar lymph node measuring approximately 1.4 cm
(image 69) and index left infrahilar lymph node measuring
approximately 1.2 cm (image 85, series 4), which given stability
[DATE] examination is favored to be reactive in etiology.
Approximately 1.3 cm presumed lymph node adjacent to the right-side
of the T9-T10 intervertebral disc space (image 107, series 4) is
also unchanged since 02/26/2014 examination. No axillary
lymphadenopathy.

Lungs/Pleura: Minimal dependent subpleural ground-glass atelectasis.
There is minimal subsegmental atelectasis within the left lower
lobe. No focal airspace opacities. No pleural effusion or
pneumothorax. The central pulmonary airways appear widely patent.

Punctate areas of nodular pleural parenchymal thickening about the
known lung apex is grossly unchanged since the [DATE] [DATE]
examination (representative image 33, series 6). No discrete
pulmonary nodules.

Upper abdomen: Limited early arterial phase evaluation of the upper
abdomen demonstrates a punctate (approximately 0.7 cm hypo
attenuating lesion within the subcapsular aspect of the lateral
segment of the left lobe of the liver which is too small to
adequately characterize though morphologically similar to the
[DATE] examination and favored to represent a hepatic cyst.

Musculoskeletal: No acute or aggressive osseous abnormalities.
Congenital fusion involving the T6 and T7 vertebral bodies. Stigmata
of DISH throughout the thoracic spine. Mild bilateral symmetric
gynecomastia. Post bilateral rotator cuff repair. Normal appearance
of the thyroid gland. Suspected atrophy involving the left kidney,
incompletely imaged.
IMPRESSION: 1. No acute cardiopulmonary disease. Specifically, no evidence of
pulmonary embolism.
2. Atherosclerosis including coronary artery calcifications.
3. Supraclavicular, mediastinal, hilar and paraspinal adenopathy is
unchanged since the [DATE] examination and thus favored to be of
benign etiology, potentially reactive or inflammatory in etiology.
4. Congenital fusion involving the T6 and T7 vertebral bodies.

## 2017-04-29 DIAGNOSIS — M109 Gout, unspecified: Secondary | ICD-10-CM | POA: Diagnosis not present

## 2017-04-29 DIAGNOSIS — R700 Elevated erythrocyte sedimentation rate: Secondary | ICD-10-CM | POA: Diagnosis not present

## 2017-04-29 DIAGNOSIS — M1990 Unspecified osteoarthritis, unspecified site: Secondary | ICD-10-CM | POA: Diagnosis not present

## 2017-04-29 DIAGNOSIS — M791 Myalgia: Secondary | ICD-10-CM | POA: Diagnosis not present

## 2017-06-22 ENCOUNTER — Encounter: Payer: Self-pay | Admitting: Emergency Medicine

## 2017-06-22 ENCOUNTER — Emergency Department: Payer: Medicare Other

## 2017-06-22 ENCOUNTER — Emergency Department
Admission: EM | Admit: 2017-06-22 | Discharge: 2017-06-22 | Disposition: A | Discharge status: Home / Self Care | Payer: Medicare Other | Attending: Emergency Medicine | Admitting: Emergency Medicine

## 2017-06-22 DIAGNOSIS — Z8673 Personal history of transient ischemic attack (TIA), and cerebral infarction without residual deficits: Secondary | ICD-10-CM | POA: Insufficient documentation

## 2017-06-22 DIAGNOSIS — R200 Anesthesia of skin: Secondary | ICD-10-CM | POA: Insufficient documentation

## 2017-06-22 DIAGNOSIS — R29818 Other symptoms and signs involving the nervous system: Secondary | ICD-10-CM | POA: Diagnosis not present

## 2017-06-22 DIAGNOSIS — Z7982 Long term (current) use of aspirin: Secondary | ICD-10-CM | POA: Diagnosis not present

## 2017-06-22 DIAGNOSIS — M6281 Muscle weakness (generalized): Secondary | ICD-10-CM | POA: Diagnosis present

## 2017-06-22 DIAGNOSIS — I10 Essential (primary) hypertension: Secondary | ICD-10-CM | POA: Insufficient documentation

## 2017-06-22 LAB — COMPREHENSIVE METABOLIC PANEL
ALK PHOS: 123 U/L (ref 38–126)
ALT: 16 U/L — AB (ref 17–63)
AST: 31 U/L (ref 15–41)
Albumin: 3.6 g/dL (ref 3.5–5.0)
Anion gap: 10 (ref 5–15)
BUN: 26 mg/dL — ABNORMAL HIGH (ref 6–20)
CALCIUM: 8.8 mg/dL — AB (ref 8.9–10.3)
CO2: 23 mmol/L (ref 22–32)
CREATININE: 1.43 mg/dL — AB (ref 0.61–1.24)
Chloride: 108 mmol/L (ref 101–111)
GFR, EST AFRICAN AMERICAN: 51 mL/min — AB (ref >60)
GFR, EST NON AFRICAN AMERICAN: 44 mL/min — AB (ref >60)
Glucose, Bld: 130 mg/dL — ABNORMAL HIGH (ref 65–99)
Potassium: 3.6 mmol/L (ref 3.5–5.1)
SODIUM: 141 mmol/L (ref 135–145)
Total Bilirubin: 0.7 mg/dL (ref 0.3–1.2)
Total Protein: 7.9 g/dL (ref 6.5–8.1)

## 2017-06-22 LAB — TROPONIN I: Troponin I: <0.03 ng/mL (ref <0.03)

## 2017-06-22 LAB — CBC
HEMATOCRIT: 39.6 % — AB (ref 40.0–52.0)
HEMOGLOBIN: 14.2 g/dL (ref 13.0–18.0)
MCH: 31 pg (ref 26.0–34.0)
MCHC: 35.8 g/dL (ref 32.0–36.0)
MCV: 86.8 fL (ref 80.0–100.0)
Platelets: 260 10*3/uL (ref 150–440)
RBC: 4.56 MIL/uL (ref 4.40–5.90)
RDW: 14.4 % (ref 11.5–14.5)
WBC: 8.2 10*3/uL (ref 3.8–10.6)

## 2017-06-22 LAB — DIFFERENTIAL
Basophils Absolute: 0.1 10*3/uL (ref 0–0.1)
Basophils Relative: 1 %
Eosinophils Absolute: 0.5 10*3/uL (ref 0–0.7)
Eosinophils Relative: 6 %
LYMPHS PCT: 24 %
Lymphs Abs: 1.9 10*3/uL (ref 1.0–3.6)
MONO ABS: 1.1 10*3/uL — AB (ref 0.2–1.0)
MONOS PCT: 14 %
NEUTROS ABS: 4.6 10*3/uL (ref 1.4–6.5)
Neutrophils Relative %: 57 %

## 2017-06-22 LAB — GLUCOSE, CAPILLARY: GLUCOSE-CAPILLARY: 122 mg/dL — AB (ref 65–99)

## 2017-06-22 LAB — APTT: aPTT: 31 seconds (ref 24–36)

## 2017-06-22 LAB — PROTIME-INR
INR: 1.04
Prothrombin Time: 13.5 seconds (ref 11.4–15.2)

## 2017-06-22 NOTE — ED Notes (Signed)
Pt ambulatory with steady gait to stat registration; c/o left upper arm numbness that started about 2 hours ago; reports to this nurse and stat registration clerk that he was moving heavy objects today; denies chest pain, denies back pain, denies headache; talking in complete coherent sentences

## 2017-06-22 NOTE — ED Provider Notes (Signed)
[facility] Emergency Department Provider Note  ____________________________________________  Time seen: Approximately 10:18 PM  I have reviewed the triage vital signs and the nursing notes.   HISTORY  Chief Complaint Extremity Weakness    HPI Darryl Roman is a 83 y.o. male who comes to the ED complaining of numbness in the left forearm that started at 5:30 PM today. It lasted for about 2 minutes and then resolved. He has a history of a stroke in 2013 that he did not see a doctor for and so he was nervous about ignoring the symptoms this time. He has been asymptomatic since about 5:32 PM. No weakness or tingling. No vision changes. No headache neck pain or falls. No fevers or chills. No chest pain abdominal pain or back pain.  He does report that he recently was driving for a prolonged period of time today gripping the steering wheel with his left hand and thinks that that may have caused the symptoms as his arm was starting to feel stiff while driving. Also notes that he's been moving heavy objects today.     Past Medical History:  Diagnosis Date  . Hypertension   . Kidney stones   . Stroke [facility])      There are no active problems to display for this patient.    Past Surgical History:  Procedure Laterality Date  . SHOULDER SURGERY       Prior to Admission medications   Medication Sig Start Date End Date Taking? Authorizing Provider  acetaminophen (TYLENOL) 500 MG tablet Take 2 tablets (1,000 mg total) by mouth every 6 (six) hours as needed for moderate pain or fever. 02/01/16   Betancourt, Aura Fey, NP  albuterol (PROVENTIL HFA;VENTOLIN HFA) 108 (90 Base) MCG/ACT inhaler Inhale 2 puffs into the lungs every 4 (four) hours as needed for wheezing or shortness of breath. 01/23/17   Frederich Cha, MD  amLODipine (NORVASC) 10 MG tablet Take 10 mg by mouth daily.    [provider]  aspirin 81 MG tablet Take 81 mg by mouth daily.    [provider]  aspirin EC 325 MG tablet Take 325 mg by mouth daily.    [provider]  atorvastatin (LIPITOR) 10 MG tablet Take 10 mg by mouth daily.    [provider]  azithromycin (ZITHROMAX Z-PAK) 250 MG tablet Take 2 tablets first day and then 1 po a day for 4 days 01/23/17   Frederich Cha, MD  chlorpheniramine-HYDROcodone [facility] PENNKINETIC ER) 10-8 MG/5ML SUER Take 5 mLs by mouth every 12 (twelve) hours as needed. 01/23/17   Frederich Cha, MD  losartan (COZAAR) 25 MG tablet Take 25 mg by mouth daily.    [provider]  Misc Natural Products (OSTEO BI-FLEX ADV DOUBLE ST PO) Take 1 tablet by mouth 2 (two) times daily.    [provider]  vitamin B-12 (CYANOCOBALAMIN) 1000 MCG tablet Take 1,000 mcg by mouth daily.    [provider]     Allergies Patient has no known allergies.   No family history on file.  Social History Social History  Substance Use Topics  . Smoking status: Never Smoker  . Smokeless tobacco: Never Used  . Alcohol use No    Review of Systems  Constitutional:   No fever or chills.  ENT:   No sore throat. No rhinorrhea. Cardiovascular:   No chest pain or syncope. Respiratory:   No dyspnea or cough. Gastrointestinal:   Negative for abdominal pain,  vomiting and diarrhea.  Musculoskeletal:   Negative for focal pain or swelling All other systems reviewed and are negative except as documented above in ROS and HPI.  ____________________________________________   PHYSICAL EXAM:  VITAL SIGNS: ED Triage Vitals [06/22/17 2012]  Enc Vitals Group     BP (!) 142/78     Pulse Rate 80     Resp 18     Temp 98 F (36.7 C)     Temp Source Oral     SpO2 96 %     Weight 195 lb (88.5 kg)     Height 5\' 9"  (1.753 m)     Head Circumference      Peak Flow      Pain Score      Pain Loc      Pain Edu?      Excl. in [place]?     Vital signs reviewed, nursing assessments reviewed.   Constitutional:   Alert and oriented.  Well appearing and in no distress. Eyes:   No scleral icterus.  EOMI. No nystagmus. No conjunctival pallor. PERRL. ENT   Head:   Normocephalic and atraumatic.   Nose:   No congestion/rhinnorhea.    Mouth/Throat:   MMM, no pharyngeal erythema. No peritonsillar mass.    Neck:   No meningismus. Full ROM Hematological/Lymphatic/Immunilogical:   No cervical lymphadenopathy. Cardiovascular:   RRR. Symmetric bilateral radial and DP pulses.  No murmurs.  Respiratory:   Normal respiratory effort without tachypnea/retractions. Breath sounds are clear and equal bilaterally. No wheezes/rales/rhonchi. Gastrointestinal:   Soft and nontender. Non distended. There is no CVA tenderness.  No rebound, rigidity, or guarding. Genitourinary:   deferred Musculoskeletal:   Normal range of motion in all extremities. No joint effusions.  No lower extremity tenderness.  No edema. Neurologic:   Normal speech and language.  Motor grossly intact. no pronator drift, normal finger to nose. NIH stroke scale 0 No gross focal neurologic deficits are appreciated.  Skin:    Skin is warm, dry and intact. No rash noted.  No petechiae, purpura, or bullae.  ____________________________________________    LABS (pertinent positives/negatives) (all labs ordered are listed, but only abnormal results are displayed) Labs Reviewed  CBC - Abnormal; Notable for the following:       Result Value   HCT 39.6 (*)    All other components within normal limits  DIFFERENTIAL - Abnormal; Notable for the following:    Monocytes Absolute 1.1 (*)    All other components within normal limits  COMPREHENSIVE METABOLIC PANEL - Abnormal; Notable for the following:    Glucose, Bld 130 (*)    BUN 26 (*)    Creatinine, Ser 1.43 (*)    Calcium 8.8 (*)    ALT 16 (*)    GFR calc non Af Amer 44 (*)    GFR calc Af Amer 51 (*)    All other components within normal limits  GLUCOSE, CAPILLARY - Abnormal; Notable for the following:     Glucose-Capillary 122 (*)    All other components within normal limits  PROTIME-INR  APTT  TROPONIN I  CBG MONITORING, ED   ____________________________________________   EKG  interpreted by me  Date: 06/22/2017  Rate: 83  Rhythm: normal sinus rhythm  QRS Axis: normal  Intervals: normal  ST/T Wave abnormalities: normal  Conduction Disutrbances: none  Narrative Interpretation: unremarkable      ____________________________________________    RADIOLOGY  Ct Head Code Stroke Wo Contrast  Result Date: 06/22/2017  CLINICAL DATA:  Code stroke.  83 y/o  M; left arm numbness. EXAM: CT HEAD WITHOUT CONTRAST TECHNIQUE: Contiguous axial images were obtained from the base of the skull through the vertex without intravenous contrast. COMPARISON:  09/01/2012 CT head.  09/02/2012 MRI head. FINDINGS: Brain: No evidence of acute infarction, hemorrhage, hydrocephalus, extra-axial collection or mass lesion/mass effect. Chronic right posterior temporal and occipital infarction. Several foci of hypoattenuation in subcortical and periventricular white matter are compatible with chronic microvascular ischemic changes, increased from prior studies. Mild interval progression of parenchymal volume loss of the brain. 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 Chronic lacunar infarcts are present within the right greater than left thalamus and the right caudate body. Vascular: Calcific atherosclerosis of carotid siphons. No hyperdense vessel identified. Skull: Normal. Negative for fracture or focal lesion. Sinuses/Orbits: No acute finding. Other: None. ASPECTS [facility] Stroke Program Early CT Score) - Ganglionic level infarction (caudate, lentiform nuclei, internal capsule, insula, M1-M3 cortex): 7 - Supraganglionic infarction (M4-M6 cortex): 3 Total score (0-10 with 10 being normal): 10 IMPRESSION: 1. No acute intracranial abnormality identified. 2. ASPECTS is 10. 3. Progression of advanced chronic  microvascular ischemic changes and moderate parenchymal volume loss of the brain. 4. Chronic infarct in right posterior temporal and occipital lobes. Chronic lacunar infarcts in basal ganglia. These results were called by telephone at the time of interpretation on 06/22/2017 at 8:36 pm to Dr. Nance Pear , who verbally acknowledged these results. Electronically Signed   By: Kristine Garbe M.D.   On: 06/22/2017 20:39    ____________________________________________   PROCEDURES Procedures  ____________________________________________   INITIAL IMPRESSION / ASSESSMENT AND PLAN / ED COURSE  Pertinent labs & imaging results that were available during my care of the patient were reviewed by me and considered in my medical decision making (see chart for details).    Clinical Course as of Jun 22 2217  Sat Jun 22, 2017  2031 Currently asymptomatic, NIH stroke scale 0, well-appearing, unremarkable vitals. Reassuring exam. Neuro consult is in progress, we'll follow-up recommendations. Patient states the reason he came is because he had some numbness in the left arm while lifting heavy luggage, and he was nervous about having a stroke because several years ago he had a stroke and did not see a doctor to get evaluated at that time. He denies any residual deficits. No deficits noted on exam currently.  [PS]    Clinical Course User Index [PS] Carrie Mew, MD    ----------------------------------------- 10:20 PM on 06/22/2017 -----------------------------------------  Neuro consult reviewed, discussed with neurologist. They recommend hospitalization for TIA workup. However, the patient does not have severe risk factors, his symptoms are unlikely stroke related today, he is asymptomatic well-appearing and compliant with his medical therapy. He takes a full dose aspirin every day. Engaged and shared decision making with the patient and he elects to go home and follow-up with his  doctor on Monday in 2 days. Return precautions for any worsening symptoms, otherwise I agree that this is a reasonable plan and the patient will be discharged home. Low suspicion for ischemic or hemorrhagic stroke, no evidence of meningitis encephalitis glaucoma temporal arteritis or intracranial hypertension.  ____________________________________________   FINAL CLINICAL IMPRESSION(S) / ED DIAGNOSES  Final diagnoses:  Arm numbness left      New Prescriptions   No medications on file     Portions of this note were generated with dragon dictation software. Dictation errors may occur despite best attempts at proofreading.  Carrie Mew, MD 06/22/17 2221

## 2017-06-22 NOTE — ED Triage Notes (Signed)
Patient with complaint of left forearm numbness that started around 17:30 today. Patient states that the numbness has resolved at this time but that now he has weakness to his left shoulder. Patient has a history of a stroke.

## 2017-06-22 NOTE — Progress Notes (Signed)
Chaplain received a page for [employer] and met with pt. No family present at the time. After talking to pt, CH went to lobby area to identified pt's wife and escorted her to pt's Rm. 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 But, he did not play soft ball today because he was unwell. Pt and wife requested prayer, which [place] offered, with pastoral presence.    06/22/17 2100  Clinical Encounter Type  Visited With Patient and family together  Visit Type Initial;Follow-up  Referral From Nurse  Consult/Referral To Chaplain  Spiritual Encounters  Spiritual Needs Prayer;Emotional;Other (Comment)

## 2017-06-22 NOTE — ED Notes (Signed)
[facility] neurologist on at bedside

## 2017-07-29 DIAGNOSIS — M1990 Unspecified osteoarthritis, unspecified site: Secondary | ICD-10-CM | POA: Diagnosis not present

## 2017-07-29 DIAGNOSIS — Z23 Encounter for immunization: Secondary | ICD-10-CM | POA: Diagnosis not present

## 2017-07-29 DIAGNOSIS — R21 Rash and other nonspecific skin eruption: Secondary | ICD-10-CM | POA: Diagnosis not present

## 2017-07-29 DIAGNOSIS — I10 Essential (primary) hypertension: Secondary | ICD-10-CM | POA: Diagnosis not present

## 2017-07-29 DIAGNOSIS — I639 Cerebral infarction, unspecified: Secondary | ICD-10-CM | POA: Diagnosis not present

## 2017-07-29 DIAGNOSIS — E7801 Familial hypercholesterolemia: Secondary | ICD-10-CM | POA: Diagnosis not present

## 2017-08-09 DIAGNOSIS — L308 Other specified dermatitis: Secondary | ICD-10-CM | POA: Diagnosis not present

## 2017-09-18 DIAGNOSIS — N5201 Erectile dysfunction due to arterial insufficiency: Secondary | ICD-10-CM | POA: Diagnosis not present

## 2017-09-18 DIAGNOSIS — D400 Neoplasm of uncertain behavior of prostate: Secondary | ICD-10-CM | POA: Diagnosis not present

## 2017-09-18 DIAGNOSIS — R351 Nocturia: Secondary | ICD-10-CM | POA: Diagnosis not present

## 2017-09-18 DIAGNOSIS — C61 Malignant neoplasm of prostate: Secondary | ICD-10-CM | POA: Diagnosis not present

## 2019-05-07 ENCOUNTER — Emergency Department
Admission: EM | Admit: 2019-05-07 | Discharge: 2019-05-07 | Disposition: A | Discharge status: Home / Self Care | Payer: Medicare Other | Attending: Emergency Medicine | Admitting: Emergency Medicine

## 2019-05-07 ENCOUNTER — Other Ambulatory Visit: Payer: Self-pay

## 2019-05-07 ENCOUNTER — Emergency Department: Payer: Medicare Other

## 2019-05-07 ENCOUNTER — Encounter: Payer: Self-pay | Admitting: Emergency Medicine

## 2019-05-07 DIAGNOSIS — R442 Other hallucinations: Secondary | ICD-10-CM | POA: Diagnosis not present

## 2019-05-07 DIAGNOSIS — I10 Essential (primary) hypertension: Secondary | ICD-10-CM | POA: Insufficient documentation

## 2019-05-07 DIAGNOSIS — Z7982 Long term (current) use of aspirin: Secondary | ICD-10-CM | POA: Insufficient documentation

## 2019-05-07 DIAGNOSIS — Z79899 Other long term (current) drug therapy: Secondary | ICD-10-CM | POA: Diagnosis not present

## 2019-05-07 DIAGNOSIS — Z8673 Personal history of transient ischemic attack (TIA), and cerebral infarction without residual deficits: Secondary | ICD-10-CM | POA: Insufficient documentation

## 2019-05-07 DIAGNOSIS — H539 Unspecified visual disturbance: Secondary | ICD-10-CM | POA: Diagnosis present

## 2019-05-07 DIAGNOSIS — R441 Visual hallucinations: Secondary | ICD-10-CM

## 2019-05-07 LAB — DIFFERENTIAL
Abs Immature Granulocytes: 0.05 10*3/uL (ref 0.00–0.07)
Basophils Absolute: 0 10*3/uL (ref 0.0–0.1)
Basophils Relative: 1 %
Eosinophils Absolute: 0.2 10*3/uL (ref 0.0–0.5)
Eosinophils Relative: 3 %
Immature Granulocytes: 1 %
Lymphocytes Relative: 33 %
Lymphs Abs: 2.2 10*3/uL (ref 0.7–4.0)
Monocytes Absolute: 0.6 10*3/uL (ref 0.1–1.0)
Monocytes Relative: 10 %
Neutro Abs: 3.5 10*3/uL (ref 1.7–7.7)
Neutrophils Relative %: 52 %

## 2019-05-07 LAB — COMPREHENSIVE METABOLIC PANEL
ALT: 34 U/L (ref 0–44)
AST: 41 U/L (ref 15–41)
Albumin: 4.2 g/dL (ref 3.5–5.0)
Alkaline Phosphatase: 157 U/L — ABNORMAL HIGH (ref 38–126)
Anion gap: 9 (ref 5–15)
BUN: 21 mg/dL (ref 8–23)
CO2: 23 mmol/L (ref 22–32)
Calcium: 8.7 mg/dL — ABNORMAL LOW (ref 8.9–10.3)
Chloride: 109 mmol/L (ref 98–111)
Creatinine, Ser: 1.07 mg/dL (ref 0.61–1.24)
GFR calc Af Amer: >60 mL/min (ref >60)
GFR calc non Af Amer: >60 mL/min (ref >60)
Glucose, Bld: 139 mg/dL — ABNORMAL HIGH (ref 70–99)
Potassium: 3.7 mmol/L (ref 3.5–5.1)
Sodium: 141 mmol/L (ref 135–145)
Total Bilirubin: 1.2 mg/dL (ref 0.3–1.2)
Total Protein: 7.2 g/dL (ref 6.5–8.1)

## 2019-05-07 LAB — APTT: aPTT: 29 seconds (ref 24–36)

## 2019-05-07 LAB — CBC
HCT: 44.2 % (ref 39.0–52.0)
Hemoglobin: 15.7 g/dL (ref 13.0–17.0)
MCH: 32.2 pg (ref 26.0–34.0)
MCHC: 35.5 g/dL (ref 30.0–36.0)
MCV: 90.6 fL (ref 80.0–100.0)
Platelets: 181 10*3/uL (ref 150–400)
RBC: 4.88 MIL/uL (ref 4.22–5.81)
RDW: 11.9 % (ref 11.5–15.5)
WBC: 6.6 10*3/uL (ref 4.0–10.5)
nRBC: 0 % (ref 0.0–0.2)

## 2019-05-07 LAB — GLUCOSE, CAPILLARY: Glucose-Capillary: 128 mg/dL — ABNORMAL HIGH (ref 70–99)

## 2019-05-07 LAB — PROTIME-INR
INR: 1.1 (ref 0.8–1.2)
Prothrombin Time: 13.9 seconds (ref 11.4–15.2)

## 2019-05-07 MED ORDER — SODIUM CHLORIDE 0.9% FLUSH: 3.0000 mL | Freq: Once | INTRAVENOUS | Status: DC

## 2019-05-07 NOTE — ED Provider Notes (Signed)
[facility] Emergency Department Provider Note  ____________________________________________   I have reviewed the triage vital signs and the nursing notes.   HISTORY  Chief Complaint Visual disturbance  History limited by: Not Limited   HPI Darryl Roman is a 85 y.o. male who presents to the emergency department today because of concern for visual hallucination that occurred earlier this afternoon. The wife says that the patient came to him saying that he saw a black cow outside. However when she looked there was no cow. When the patient looked again he did not see any further cow. The patient denies any headache or recent illness. Patient and wife had some concern about possible stroke given that he was told he had a stroke previously after he had vision change.   Records reviewed. Per medical record review patient has a history of HTN, stroke.   Past Medical History:  Diagnosis Date  . Hypertension   . Kidney stones   . Stroke [facility])     There are no active problems to display for this patient.   Past Surgical History:  Procedure Laterality Date  . SHOULDER SURGERY      Prior to Admission medications   Medication Sig Start Date End Date Taking? Authorizing Provider  acetaminophen (TYLENOL) 500 MG tablet Take 2 tablets (1,000 mg total) by mouth every 6 (six) hours as needed for moderate pain or fever. 02/01/16   Betancourt, Aura Fey, NP  albuterol (PROVENTIL HFA;VENTOLIN HFA) 108 (90 Base) MCG/ACT inhaler Inhale 2 puffs into the lungs every 4 (four) hours as needed for wheezing or shortness of breath. 01/23/17   Frederich Cha, MD  amLODipine (NORVASC) 10 MG tablet Take 10 mg by mouth daily.    [provider]  aspirin 81 MG tablet Take 81 mg by mouth daily.    [provider]  aspirin EC 325 MG tablet Take 325 mg by mouth daily.    [provider]  atorvastatin (LIPITOR) 10 MG tablet Take 10 mg by mouth daily.    [provider]  azithromycin (ZITHROMAX Z-PAK) 250 MG tablet Take 2 tablets first day and then 1 po a day for 4 days 01/23/17   Frederich Cha, MD  chlorpheniramine-HYDROcodone [facility] PENNKINETIC ER) 10-8 MG/5ML SUER Take 5 mLs by mouth every 12 (twelve) hours as needed. 01/23/17   Frederich Cha, MD  losartan (COZAAR) 25 MG tablet Take 25 mg by mouth daily.    [provider]  Misc Natural Products (OSTEO BI-FLEX ADV DOUBLE ST PO) Take 1 tablet by mouth 2 (two) times daily.    [provider]  vitamin B-12 (CYANOCOBALAMIN) 1000 MCG tablet Take 1,000 mcg by mouth daily.    [provider]    Allergies Patient has no known allergies.  No family history on file.  Social History Social History   Tobacco Use  . Smoking status: Never Smoker  . Smokeless tobacco: Never Used  Substance Use Topics  . Alcohol use: No  . Drug use: No    Review of Systems Constitutional: No fever/chills Eyes: Positive for visual hallucination. ENT: No sore throat. Cardiovascular: Denies chest pain. Respiratory: Denies shortness of breath. Gastrointestinal: No abdominal pain.  No nausea, no vomiting.  No diarrhea.   Genitourinary: Negative for dysuria. Musculoskeletal: Negative for back pain. Skin: Negative for rash. Neurological: Negative for headaches, focal weakness or numbness.  ____________________________________________   PHYSICAL EXAM:  VITAL SIGNS: ED Triage Vitals  Enc Vitals Group  BP 05/07/19 1400 (!) 153/76     Pulse Rate 05/07/19 1400 77     Resp 05/07/19 1400 18     Temp 05/07/19 1400 97.8 F (36.6 C)     Temp Source 05/07/19 1400 Oral     SpO2 05/07/19 1400 94 %     Weight 05/07/19 1401 193 lb (87.5 kg)     Height 05/07/19 1401 '5\' 9"'  (1.753 m)     Head Circumference --      Peak Flow --      Pain Score 05/07/19 1410 0   Constitutional: Alert and oriented.  Eyes: Conjunctivae are normal.  ENT      Head: Normocephalic and atraumatic.       Nose: No congestion/rhinnorhea.      Mouth/Throat: Mucous membranes are moist.      Neck: No stridor. Hematological/Lymphatic/Immunilogical: No cervical lymphadenopathy. Cardiovascular: Normal rate, regular rhythm.  No murmurs, rubs, or gallops.  Respiratory: Normal respiratory effort without tachypnea nor retractions. Breath sounds are clear and equal bilaterally. No wheezes/rales/rhonchi. Gastrointestinal: Soft and non tender. No rebound. No guarding.  Genitourinary: Deferred Musculoskeletal: Normal range of motion in all extremities. No lower extremity edema. Neurologic:  Normal speech and language. PERRL. EOMI. Strength 5/5 in upper and lower extremities. Sensation grossly intact. No gross focal neurologic deficits are appreciated.  Skin:  Skin is warm, dry and intact. No rash noted. Psychiatric: Mood and affect are normal. Speech and behavior are normal. Patient exhibits appropriate insight and judgment.  ____________________________________________    LABS (pertinent positives/negatives)  INR 1.1 CMP wnl except glu 139, ca 8.7, alk phos 157 CBC wbc 6.6, hgb 15.7, plt 181  ____________________________________________   EKG  I, Nance Pear, attending physician, personally viewed and interpreted this EKG  EKG Time: 1442 Rate: 68 Rhythm: normal sinus rhythm Axis: left axis deviation Intervals: qtc 459 QRS: narrow ST changes: no st elevation Impression: abnormal ekg   ____________________________________________    RADIOLOGY  CT head No acute abnormality   ____________________________________________   PROCEDURES  Procedures  ____________________________________________   INITIAL IMPRESSION / ASSESSMENT AND PLAN / ED COURSE  Pertinent labs & imaging results that were available during my care of the patient were reviewed by me and considered in my medical decision making (see chart for details).   Patient presented to the emergency department today  because of concern for visual hallucination. Thought he saw a cow that wife did not see. Patient denies any other symptoms. Exam is reassuring. At this time doubt stroke given symptoms. Discussed work up with patient and wife. Did discuss MRI however I have very low suspicion for CVA at this time. Patient has appointment scheduled next week with PCP.   ____________________________________________   FINAL CLINICAL IMPRESSION(S) / ED DIAGNOSES  Final diagnoses:  Visual hallucination     Note: This dictation was prepared with Dragon dictation. Any transcriptional errors that result from this process are unintentional     Nance Pear, MD 05/07/19 2001

## 2019-05-07 NOTE — ED Notes (Signed)
POC Blood glucose = 128.

## 2019-05-07 NOTE — ED Triage Notes (Addendum)
Patient presents to the ED for, "seeing things."  Patient was normal when he went to sleep for a nap at noon, but when he woke up he saw a black path outside which is not there.  Wife states this is similar to when he had a stroke 6 years ago.  Patient has no unilateral weakness, denies confusion, and denies difficulty speaking.  Patient and wife state that patient is back at baseline and not seeing things anymore.   No obvious distress at this time.

## 2019-05-07 NOTE — Discharge Instructions (Addendum)
Please seek medical attention for any high fevers, chest pain, shortness of breath, change in behavior, persistent vomiting, bloody stool or any other new or concerning symptoms.  

## 2019-10-08 ENCOUNTER — Other Ambulatory Visit: Payer: Self-pay | Admitting: Surgery

## 2019-10-12 ENCOUNTER — Encounter
Admission: RE | Admit: 2019-10-12 | Discharge: 2019-10-12 | Disposition: A | Discharge status: Home / Self Care | Payer: Medicare Other | Source: Ambulatory Visit | Attending: Surgery | Admitting: Surgery

## 2019-10-12 ENCOUNTER — Other Ambulatory Visit: Payer: Self-pay

## 2019-10-12 ENCOUNTER — Other Ambulatory Visit
Admission: RE | Admit: 2019-10-12 | Discharge: 2019-10-12 | Disposition: A | Discharge status: Home / Self Care | Payer: Medicare Other | Source: Ambulatory Visit | Attending: Surgery | Admitting: Surgery

## 2019-10-12 DIAGNOSIS — Z01812 Encounter for preprocedural laboratory examination: Secondary | ICD-10-CM | POA: Insufficient documentation

## 2019-10-12 DIAGNOSIS — Z20822 Contact with and (suspected) exposure to covid-19: Secondary | ICD-10-CM | POA: Insufficient documentation

## 2019-10-12 HISTORY — DX: Personal history of urinary calculi: Z87.442

## 2019-10-12 HISTORY — DX: Malignant neoplasm of prostate: C61

## 2019-10-12 LAB — SARS CORONAVIRUS 2 (TAT 6-24 HRS): SARS Coronavirus 2: NEGATIVE

## 2019-10-12 NOTE — Patient Instructions (Signed)
Your procedure is scheduled on: 10/14/19 Report to [facility]. To find out your arrival time please call [phone] between 1PM - 3PM on 10/13/19.  Remember: Instructions that are not followed completely may result in serious medical risk, up to and including death, or upon the discretion of your surgeon and anesthesiologist your surgery may need to be rescheduled.     _X__ 1. Do not eat food after midnight the night before your procedure.                 No gum chewing or hard candies. You may drink clear liquids up to 2 hours                 before you are scheduled to arrive for your surgery- DO not drink clear                 liquids within 2 hours of the start of your surgery.                 Clear Liquids include:  water, apple juice without pulp, clear carbohydrate                 drink such as Clearfast or Gatorade, Black Coffee or Tea (Do not add                 anything to coffee or tea). Diabetics water only  __X__2.  On the morning of surgery brush your teeth with toothpaste and water, you                 may rinse your mouth with mouthwash if you wish.  Do not swallow any              toothpaste of mouthwash.     _X__ 3.  No Alcohol for 24 hours before or after surgery.   _X__ 4.  Do Not Smoke or use e-cigarettes For 24 Hours Prior to Your Surgery.                 Do not use any chewable tobacco products for at least 6 hours prior to                 surgery.  ____  5.  Bring all medications with you on the day of surgery if instructed.   __X__  6.  Notify your doctor if there is any change in your medical condition      (cold, fever, infections).     Do not wear jewelry, make-up, hairpins, clips or nail polish. Do not wear lotions, powders, or perfumes.  Do not shave 48 hours prior to surgery. Men may shave face and neck. Do not bring valuables to the hospital.    [facility] is not responsible for any belongings or  valuables.  Contacts, dentures/partials or body piercings may not be worn into surgery. Bring a case for your contacts, glasses or hearing aids, a denture cup will be supplied. Leave your suitcase in the car. After surgery it may be brought to your room. For patients admitted to the hospital, discharge time is determined by your treatment team.   Patients discharged the day of surgery will not be allowed to drive home.   Please read over the following fact sheets that you were given:   MRSA Information  __X__ Take these medicines the morning of surgery with A SIP OF WATER:  1. amlodipine  2.   3.   4.  5.  6.  ____ Fleet Enema (as directed)   ____ Use CHG Soap/SAGE wipes as directed  ____ Use inhalers on the day of surgery  ____ Stop metformin/Janumet/Farxiga 2 days prior to surgery    ____ Take 1/2 of usual insulin dose the night before surgery. No insulin the morning          of surgery.   ____ Stop Blood Thinners Coumadin/Plavix/Xarelto/Pleta/Pradaxa/Eliquis/Effient/Aspirin  on   Or contact your Surgeon, Cardiologist or Medical Doctor regarding  ability to stop your blood thinners  __X__ Stop Anti-inflammatories 7 days before surgery such as Advil, Ibuprofen, Motrin,  BC or Goodies Powder, Naprosyn, Naproxen, Aleve, Aspirin    __X__ Stop all herbal supplements, fish oil or vitamin E until after surgery.    ____ Bring C-Pap to the hospital.

## 2019-10-13 ENCOUNTER — Other Ambulatory Visit: Payer: Medicare Other

## 2019-10-13 MED ORDER — CEFAZOLIN SODIUM-DEXTROSE 2-4 GM/100ML-% IV SOLN
2.0000 g | INTRAVENOUS | Status: AC
  Administered 2019-10-14: 2 g via INTRAVENOUS

## 2019-10-14 ENCOUNTER — Ambulatory Visit: Payer: Medicare Other | Admitting: Anesthesiology

## 2019-10-14 ENCOUNTER — Ambulatory Visit
Admission: RE | Admit: 2019-10-14 | Discharge: 2019-10-14 | Disposition: A | Discharge status: Home / Self Care | Payer: Medicare Other | Attending: Surgery | Admitting: Surgery

## 2019-10-14 ENCOUNTER — Encounter: Payer: Self-pay | Admitting: Surgery

## 2019-10-14 ENCOUNTER — Encounter
Admission: RE | Disposition: A | Discharge status: Home / Self Care | Payer: Self-pay | Source: Home / Self Care | Attending: Surgery

## 2019-10-14 DIAGNOSIS — Z79899 Other long term (current) drug therapy: Secondary | ICD-10-CM | POA: Diagnosis not present

## 2019-10-14 DIAGNOSIS — M67442 Ganglion, left hand: Secondary | ICD-10-CM | POA: Insufficient documentation

## 2019-10-14 DIAGNOSIS — Z7982 Long term (current) use of aspirin: Secondary | ICD-10-CM | POA: Diagnosis not present

## 2019-10-14 DIAGNOSIS — I10 Essential (primary) hypertension: Secondary | ICD-10-CM | POA: Insufficient documentation

## 2019-10-14 DIAGNOSIS — Z8673 Personal history of transient ischemic attack (TIA), and cerebral infarction without residual deficits: Secondary | ICD-10-CM | POA: Insufficient documentation

## 2019-10-14 DIAGNOSIS — Z8546 Personal history of malignant neoplasm of prostate: Secondary | ICD-10-CM | POA: Insufficient documentation

## 2019-10-14 DIAGNOSIS — R2232 Localized swelling, mass and lump, left upper limb: Secondary | ICD-10-CM | POA: Diagnosis present

## 2019-10-14 HISTORY — PX: MASS EXCISION: SHX2000

## 2019-10-14 SURGERY — MINOR EXCISION OF MASS
Anesthesia: General | Site: Hand | Laterality: Left

## 2019-10-14 MED ORDER — BUPIVACAINE HCL (PF) 0.5 % IJ SOLN
INTRAMUSCULAR | Status: DC | PRN
  Administered 2019-10-14: 10 mL

## 2019-10-14 MED ORDER — ONDANSETRON HCL 4 MG/2ML IJ SOLN: 4.0000 mg | Freq: Four times a day (QID) | INTRAMUSCULAR | Status: DC | PRN

## 2019-10-14 MED ORDER — BUPIVACAINE HCL (PF) 0.5 % IJ SOLN
INTRAMUSCULAR | Status: AC
  Filled 2019-10-14: qty 30

## 2019-10-14 MED ORDER — PROPOFOL 10 MG/ML IV BOLUS
INTRAVENOUS | Status: DC | PRN
  Administered 2019-10-14: 70 mg via INTRAVENOUS
  Administered 2019-10-14: 120 mg via INTRAVENOUS

## 2019-10-14 MED ORDER — LIDOCAINE HCL (CARDIAC) PF 100 MG/5ML IV SOSY
PREFILLED_SYRINGE | INTRAVENOUS | Status: DC | PRN
  Administered 2019-10-14: 100 mg via INTRAVENOUS

## 2019-10-14 MED ORDER — ONDANSETRON HCL 4 MG PO TABS: 4.0000 mg | ORAL_TABLET | Freq: Four times a day (QID) | ORAL | Status: DC | PRN

## 2019-10-14 MED ORDER — FAMOTIDINE 20 MG PO TABS
ORAL_TABLET | ORAL | Status: AC
  Administered 2019-10-14: 20 mg via ORAL
  Filled 2019-10-14: qty 1

## 2019-10-14 MED ORDER — ONDANSETRON HCL 4 MG/2ML IJ SOLN: 4.0000 mg | Freq: Once | INTRAMUSCULAR | Status: DC | PRN

## 2019-10-14 MED ORDER — HYDROCODONE-ACETAMINOPHEN 5-325 MG PO TABS: 1.0000 | ORAL_TABLET | Freq: Four times a day (QID) | ORAL | 0 refills | Status: DC | PRN

## 2019-10-14 MED ORDER — GLYCOPYRROLATE 0.2 MG/ML IJ SOLN
INTRAMUSCULAR | Status: DC | PRN
  Administered 2019-10-14: .2 mg via INTRAVENOUS

## 2019-10-14 MED ORDER — TRIAMCINOLONE ACETONIDE 40 MG/ML IJ SUSP
INTRAMUSCULAR | Status: AC
  Filled 2019-10-14: qty 1

## 2019-10-14 MED ORDER — LIDOCAINE HCL (PF) 2 % IJ SOLN
INTRAMUSCULAR | Status: AC
  Filled 2019-10-14: qty 10

## 2019-10-14 MED ORDER — METOCLOPRAMIDE HCL 10 MG PO TABS: 5.0000 mg | ORAL_TABLET | Freq: Three times a day (TID) | ORAL | Status: DC | PRN

## 2019-10-14 MED ORDER — FENTANYL CITRATE (PF) 100 MCG/2ML IJ SOLN: 25.0000 ug | INTRAMUSCULAR | Status: DC | PRN

## 2019-10-14 MED ORDER — CHLORHEXIDINE GLUCONATE 4 % EX LIQD: 60.0000 mL | Freq: Once | CUTANEOUS | Status: DC

## 2019-10-14 MED ORDER — FENTANYL CITRATE (PF) 100 MCG/2ML IJ SOLN
INTRAMUSCULAR | Status: DC | PRN
  Administered 2019-10-14: 25 ug via INTRAVENOUS
  Administered 2019-10-14: 50 ug via INTRAVENOUS

## 2019-10-14 MED ORDER — LACTATED RINGERS IV SOLN: INTRAVENOUS | Status: DC

## 2019-10-14 MED ORDER — CEFAZOLIN SODIUM-DEXTROSE 2-4 GM/100ML-% IV SOLN
INTRAVENOUS | Status: AC
  Filled 2019-10-14: qty 100

## 2019-10-14 MED ORDER — EPINEPHRINE PF 1 MG/ML IJ SOLN
INTRAMUSCULAR | Status: AC
  Filled 2019-10-14: qty 1

## 2019-10-14 MED ORDER — METOCLOPRAMIDE HCL 5 MG/ML IJ SOLN: 5.0000 mg | Freq: Three times a day (TID) | INTRAMUSCULAR | Status: DC | PRN

## 2019-10-14 MED ORDER — PROPOFOL 10 MG/ML IV BOLUS
INTRAVENOUS | Status: AC
  Filled 2019-10-14: qty 20

## 2019-10-14 MED ORDER — POTASSIUM CHLORIDE IN NACL 20-0.9 MEQ/L-% IV SOLN
INTRAVENOUS | Status: DC
  Filled 2019-10-14: qty 1000

## 2019-10-14 MED ORDER — GLYCOPYRROLATE 0.2 MG/ML IJ SOLN
INTRAMUSCULAR | Status: AC
  Filled 2019-10-14: qty 1

## 2019-10-14 MED ORDER — FAMOTIDINE 20 MG PO TABS: 20.0000 mg | ORAL_TABLET | Freq: Once | ORAL | Status: AC

## 2019-10-14 MED ORDER — HYDROCODONE-ACETAMINOPHEN 5-325 MG PO TABS: 1.0000 | ORAL_TABLET | ORAL | Status: DC | PRN

## 2019-10-14 MED ORDER — FENTANYL CITRATE (PF) 100 MCG/2ML IJ SOLN
INTRAMUSCULAR | Status: AC
  Filled 2019-10-14: qty 2

## 2019-10-14 SURGICAL SUPPLY — 34 items
BNDG COHESIVE 4X5 TAN STRL (GAUZE/BANDAGES/DRESSINGS) ×2 IMPLANT
BNDG ELASTIC 2X5.8 VLCR STR LF (GAUZE/BANDAGES/DRESSINGS) ×1 IMPLANT
BNDG ELASTIC 3X5.8 VLCR STR LF (GAUZE/BANDAGES/DRESSINGS) ×1 IMPLANT
BNDG ELASTIC 4X5.8 VLCR STR LF (GAUZE/BANDAGES/DRESSINGS) ×1 IMPLANT
BNDG ESMARK 4X12 TAN STRL LF (GAUZE/BANDAGES/DRESSINGS) ×2 IMPLANT
CANISTER SUCT 1200ML W/VALVE (MISCELLANEOUS) ×2 IMPLANT
CHLORAPREP W/TINT 26 (MISCELLANEOUS) ×2 IMPLANT
CORD BIP STRL DISP 12FT (MISCELLANEOUS) ×2 IMPLANT
COVER WAND RF STERILE (DRAPES) ×2 IMPLANT
ELECT REM PT RETURN 9FT ADLT (ELECTROSURGICAL) ×2
ELECTRODE REM PT RTRN 9FT ADLT (ELECTROSURGICAL) ×1 IMPLANT
FORCEPS JEWEL BIP 4-3/4 STR (INSTRUMENTS) ×2 IMPLANT
GAUZE SPONGE 4X4 12PLY STRL (GAUZE/BANDAGES/DRESSINGS) ×2 IMPLANT
GAUZE XEROFORM 1X8 LF (GAUZE/BANDAGES/DRESSINGS) ×2 IMPLANT
GAUZE XEROFORM 4X4 STRL (GAUZE/BANDAGES/DRESSINGS) ×1 IMPLANT
GLOVE BIO SURGEON STRL SZ8 (GLOVE) ×4 IMPLANT
GLOVE INDICATOR 8.0 STRL GRN (GLOVE) ×2 IMPLANT
GOWN STRL REUS W/ TWL LRG LVL3 (GOWN DISPOSABLE) ×1 IMPLANT
GOWN STRL REUS W/ TWL XL LVL3 (GOWN DISPOSABLE) ×1 IMPLANT
GOWN STRL REUS W/TWL LRG LVL3 (GOWN DISPOSABLE) ×1
GOWN STRL REUS W/TWL XL LVL3 (GOWN DISPOSABLE) ×1
NDL FILTER BLUNT 18X1 1/2 (NEEDLE) ×1 IMPLANT
NDL HYPO 21X1.5 SAFETY (NEEDLE) ×1 IMPLANT
NEEDLE FILTER BLUNT 18X 1/2SAF (NEEDLE) ×1
NEEDLE FILTER BLUNT 18X1 1/2 (NEEDLE) ×1 IMPLANT
NEEDLE HYPO 21X1.5 SAFETY (NEEDLE) ×2 IMPLANT
NS IRRIG 500ML POUR BTL (IV SOLUTION) ×2 IMPLANT
PACK EXTREMITY ARMC (MISCELLANEOUS) ×2 IMPLANT
STOCKINETTE IMPERV 14X48 (MISCELLANEOUS) ×2 IMPLANT
SUT PROLENE 4 0 PS 2 18 (SUTURE) ×2 IMPLANT
SUT VIC AB 3-0 SH 27 (SUTURE) ×1
SUT VIC AB 3-0 SH 27X BRD (SUTURE) ×1 IMPLANT
SYR 20ML LL LF (SYRINGE) ×2 IMPLANT
SYR 5ML LL (SYRINGE) ×2 IMPLANT

## 2019-10-14 NOTE — Anesthesia Preprocedure Evaluation (Signed)
Anesthesia Evaluation  Patient identified by MRN, date of birth, ID band Patient awake    Reviewed: Allergy & Precautions, NPO status , Patient's Chart, lab work & pertinent test results  Airway Mallampati: II       Dental   Pulmonary neg pulmonary ROS,           Cardiovascular hypertension, Pt. on medications      Neuro/Psych CVA negative psych ROS   GI/Hepatic negative GI ROS, Neg liver ROS,   Endo/Other  negative endocrine ROS  Renal/GU stones  negative genitourinary   Musculoskeletal   Abdominal   Peds negative pediatric ROS (+)  Hematology negative hematology ROS (+)   Anesthesia Other Findings Past Medical History: No date: History of kidney stones No date: Hypertension No date: Kidney stones No date: Prostate cancer ([place]) No date: Stroke [facility])  Reproductive/Obstetrics                             Anesthesia Physical Anesthesia Plan  ASA: III  Anesthesia Plan: General   Post-op Pain Management:    Induction: Intravenous  PONV Risk Score and Plan:   Airway Management Planned: LMA  Additional Equipment:   Intra-op Plan:   Post-operative Plan: Extubation in OR  Informed Consent: I have reviewed the patients History and Physical, chart, labs and discussed the procedure including the risks, benefits and alternatives for the proposed anesthesia with the patient or authorized representative who has indicated his/her understanding and acceptance.     Dental advisory given  Plan Discussed with: CRNA and Surgeon  Anesthesia Plan Comments:         Anesthesia Quick Evaluation

## 2019-10-14 NOTE — Discharge Instructions (Addendum)
AMBULATORY SURGERY  DISCHARGE INSTRUCTIONS   1) The drugs that you were given will stay in your system until tomorrow so for the next 24 hours you should not:  A) Drive an automobile B) Make any legal decisions C) Drink any alcoholic beverage   2) You may resume regular meals tomorrow.  Today it is better to start with liquids and gradually work up to solid foods.  You may eat anything you prefer, but it is better to start with liquids, then soup and crackers, and gradually work up to solid foods.   3) Please notify your doctor immediately if you have any unusual bleeding, trouble breathing, redness and pain at the surgery site, drainage, fever, or pain not relieved by medication.    4) Additional Instructions:        Please contact your physician with any problems or Same Day Surgery at [phone], Monday through Friday 6 am to 4 pm, or [place] at [facility] number at [phone].AMBULATORY SURGERY              Please contact your physician with any problems or Same Day Surgery at [phone], Monday through Friday 6 am to 4 pm, or  at [facility] number at [phone].   Orthopedic discharge instructions: Keep dressing dry and intact. Keep hand elevated above heart level. May shower after dressing removed on postop day 4 (Sunday). Cover sutures with Band-Aids after drying off. Apply ice to affected area frequently. Take ibuprofen 600 mg TID with meals for 5-7 days, then as necessary. Take ES Tylenol or pain medication as prescribed when needed.  Return for follow-up in 10-14 days or as scheduled.

## 2019-10-14 NOTE — Anesthesia Procedure Notes (Signed)
Procedure Name: LMA Insertion Date/Time: 10/14/2019 9:04 AM Performed by: Bernardo Heater, CRNA Pre-anesthesia Checklist: Patient identified, Patient being monitored, Timeout performed, Emergency Drugs available and Suction available Patient Re-evaluated:Patient Re-evaluated prior to induction Oxygen Delivery Method: Circle system utilized Preoxygenation: Pre-oxygenation with 100% oxygen Induction Type: IV induction Ventilation: Mask ventilation without difficulty LMA: LMA inserted LMA Size: 5.0 Tube type: Oral Number of attempts: 1 Placement Confirmation: positive ETCO2 and breath sounds checked- equal and bilateral Tube secured with: Tape Dental Injury: Teeth and Oropharynx as per pre-operative assessment

## 2019-10-14 NOTE — H&P (Signed)
Paper H&P to be scanned into permanent record. H&P reviewed and patient re-examined. No changes. 

## 2019-10-14 NOTE — Transfer of Care (Signed)
Immediate Anesthesia Transfer of Care Note  Patient: Darryl Roman  Procedure(s) Performed: EXCISION SOFT TISSUE MAS OF PALM (Left Hand)  Patient Location: PACU  Anesthesia Type:General  Level of Consciousness: sedated  Airway & Oxygen Therapy: Patient Spontanous Breathing and Patient connected to nasal cannula oxygen  Post-op Assessment: Report given to RN  Post vital signs: Reviewed and stable  Last Vitals:  Vitals Value Taken Time  BP 131/74 10/14/19 0950  Temp    Pulse 68 10/14/19 0952  Resp 12 10/14/19 0952  SpO2 93 % 10/14/19 0952  Vitals shown include unvalidated device data.  Last Pain:  Vitals:   10/14/19 0713  TempSrc: Tympanic  PainSc: 0-No pain         Complications: No apparent anesthesia complications

## 2019-10-14 NOTE — Op Note (Signed)
10/14/2019  9:44 AM  Patient:   Darryl Roman  Pre-Op Diagnosis:   Benign-appearing soft tissue mass, left palm.  Post-Op Diagnosis:   Flexor sheath ganglion of left ring finger.  Procedure:   Excision of flexor sheath ganglion left ring finger with release of A1 pulley.  Surgeon:   Pascal Lux, MD  Assistant:   Cherlynn June, PA-S  Anesthesia:   General LMA  Findings:   As above.  Complications:   None  Fluids:   500 cc crystalloid  EBL:   0 cc  UOP:   None  TT:   26 minutes at 250 mmHg  Drains:   None  Closure:   4-0 Prolene interrupted sutures  Brief Clinical Note:   The patient is an 86 year old male with a 1.5 year history of an intermittently painful mass on the palmar aspect of his left hand.  This mass has become more uncomfortable over the past few months and has gotten larger until a few weeks ago when it again began to get smaller.  His history and examination are consistent with a benign neoplasm of the left palm, most likely a ganglion cyst.  The patient presents at this time for excision of the left palmar soft tissue mass.  Procedure:   The patient was brought into the operating room and lain in the supine position.  After adequate general laryngeal mask anesthesia was obtained, the patient's left hand and upper extremity were prepped with ChloraPrep solution before being draped sterilely.  Preoperative antibiotics were administered.  A timeout was performed to verify the appropriate surgical site before the limb was exsanguinated with an Esmarch and the tourniquet inflated to 250 mmHg.  An approximately 1.5-2 cm oblique incision was made over the mass.  The incision was carried down through the subcutaneous tissues to expose the mass.  It had the appearance of a benign ganglion cyst involving the flexor sheath of the left ring finger, just proximal to the A1 pulley.  The cyst was excised circumferentially before being removed, along with a piece of the  flexor tenosynovium.  The underlying tendons were inspected and found to be intact.  Given that we were in the area, it was felt best to proceed with release of the A1 pulley to reduce the likelihood of developing a trigger finger, so this was conducted under direct visualization using a #15 blade and Metzenbaum scissors.  Again, the underlying tendons were inspected and found to be intact.  The wound was copiously irrigated with sterile saline solution before being closed using 4-0 Prolene interrupted sutures.  A total of 10 cc of 0.5% plain Sensorcaine was injected in and around the incision site to help with postoperative analgesia before a sterile bulky dressing was applied to the hand.  The patient was then awakened, extubated, and returned to the recovery room in satisfactory condition after tolerating the procedure well.

## 2019-10-15 LAB — SURGICAL PATHOLOGY

## 2019-10-15 NOTE — Anesthesia Postprocedure Evaluation (Signed)
Anesthesia Post Note  Patient: Darryl Roman  Procedure(s) Performed: EXCISION SOFT TISSUE MAS OF PALM (Left Hand)  Patient location during evaluation: PACU Anesthesia Type: General Level of consciousness: awake and alert and oriented Pain management: pain level controlled Vital Signs Assessment: post-procedure vital signs reviewed and stable Respiratory status: spontaneous breathing Cardiovascular status: blood pressure returned to baseline Anesthetic complications: no     Last Vitals:  Vitals:   10/14/19 1113 10/14/19 1123  BP: 137/75 (!) 142/76  Pulse:  69  Resp: 16 16  Temp: (!) 36.1 C 36.6 C  SpO2:  95%    Last Pain:  Vitals:   10/14/19 1123  TempSrc: Temporal  PainSc: 0-No pain                 Lechelle Wrigley

## 2020-06-21 LAB — COLOGUARD: COLOGUARD: NEGATIVE

## 2022-07-04 ENCOUNTER — Ambulatory Visit
Admission: EM | Admit: 2022-07-04 | Discharge: 2022-07-04 | Disposition: A | Discharge status: Home / Self Care | Payer: Medicare Other | Attending: Family Medicine | Admitting: Family Medicine

## 2022-07-04 DIAGNOSIS — U071 COVID-19: Secondary | ICD-10-CM | POA: Diagnosis present

## 2022-07-04 DIAGNOSIS — Z20822 Contact with and (suspected) exposure to covid-19: Secondary | ICD-10-CM | POA: Diagnosis present

## 2022-07-04 LAB — RESP PANEL BY RT-PCR (FLU A&B, COVID) ARPGX2
Influenza A by PCR: NEGATIVE
Influenza B by PCR: NEGATIVE
SARS Coronavirus 2 by RT PCR: POSITIVE — AB

## 2022-07-04 MED ORDER — MOLNUPIRAVIR EUA 200MG CAPSULE: 4.0000 | ORAL_CAPSULE | Freq: Two times a day (BID) | ORAL | 0 refills | Status: AC

## 2022-07-04 NOTE — ED Triage Notes (Signed)
Patient reports that he has been coughing. Patients wife tested positive covid test on Sunday.   Patient has had a cough and sore throat for about 2 days now.

## 2022-07-04 NOTE — Discharge Instructions (Addendum)
You were exposed to COVID and has symptoms. Please continue isolation at home for at least 5 days since the start of your symptoms. Once you complete your 5 day quarantine, you may return to normal activities as long as you've not had a fever for over 24 hours(without taking fever reducing medicine) and your symptoms are improving. Be sure to wear a mask until Day 11.   Please continue good preventive care measures, including:  frequent hand-washing, avoid touching your face, cover coughs/sneezes, stay out of crowds and keep a 6 foot distance from others.  Go to the nearest hospital emergency room if fever/cough/breathlessness are severe or illness seems like a threat to life.

## 2022-07-04 NOTE — ED Provider Notes (Signed)
[facility]    CSN: [id number] Arrival date & time: 07/04/22  1607      History   Chief Complaint Chief Complaint  Patient presents with   Cough    HPI Darryl Roman is a 88 y.o. male.   HPI   Keturah Barre brought in by his son after home COVID exposure.  Patient's wife was diagnosed with COVID over the weekend.  Mr. Azzara started having symptoms yesterday.  He is reporting sore throat that is better after gargling with salt water.  He has a slight cough and myalgias.  Denies fever, chest pain, shortness of breath, abdominal pain, vomiting, diarrhea, rash, nasal congestion.  He has been trying to eat as much as he can and stay hydrated.       Past Medical History:  Diagnosis Date   History of kidney stones    Hypertension    Kidney stones    Prostate cancer ([place])    Stroke ([place])     There are no problems to display for this patient.   Past Surgical History:  Procedure Laterality Date   KIDNEY STONE SURGERY     MASS EXCISION Left 10/14/2019   Procedure: EXCISION SOFT TISSUE MAS OF PALM;  Surgeon: Corky Mull, MD;  Location: [facility] ORS;  Service: Orthopedics;  Laterality: Left;   SHOULDER SURGERY         Home Medications    Prior to Admission medications   Medication Sig Start Date End Date Taking? Authorizing Provider  amLODipine (NORVASC) 10 MG tablet Take 10 mg by mouth daily.   Yes [provider]  aspirin EC 325 MG tablet Take 325 mg by mouth daily.   Yes [provider]  fluticasone (FLONASE) 50 MCG/ACT nasal spray Place 1 spray into both nostrils at bedtime as needed for allergies or rhinitis.   Yes [provider]  losartan (COZAAR) 25 MG tablet Take 25 mg by mouth daily.   Yes [provider]  molnupiravir EUA (LAGEVRIO) 200 mg CAPS capsule Take 4 capsules (800 mg total) by mouth 2 (two) times daily for 5 days. 07/04/22 07/09/22 Yes Kadesia Robel, DO  rosuvastatin (CRESTOR) 5 MG tablet Take 5 mg by mouth  at bedtime.   Yes [provider]  vitamin B-12 (CYANOCOBALAMIN) 1000 MCG tablet Take 1,000 mcg by mouth daily.   Yes [provider]  HYDROcodone-acetaminophen (NORCO) 5-325 MG tablet Take 1 tablet by mouth every 6 (six) hours as needed for moderate pain. MAXIMUM TOTAL ACETAMINOPHEN DOSE IS 4000 MG PER DAY 10/14/19   Poggi, Marshall Cork, MD    Family History History reviewed. No pertinent family history.  Social History Social History   Tobacco Use   Smoking status: Never   Smokeless tobacco: Never  Vaping Use   Vaping Use: Never used  Substance Use Topics   Alcohol use: No   Drug use: No     Allergies   Patient has no known allergies.   Review of Systems Review of Systems: negative unless otherwise stated in HPI.      Physical Exam Triage Vital Signs ED Triage Vitals  Enc Vitals Group     BP 07/04/22 0928 (!) 141/70     Pulse Rate 07/04/22 0928 75     Resp --      Temp 07/04/22 0928 98.6 F (37 C)     Temp Source 07/04/22 0928 Oral     SpO2 07/04/22 0928 96 %     Weight 07/04/22  0926 199 lb (90.3 kg)     Height 07/04/22 0926 '5\' 9"'$  (1.753 m)     Head Circumference --      Peak Flow --      Pain Score 07/04/22 0925 0     Pain Loc --      Pain Edu? --      Excl. in [place]? --    No data found.  Updated Vital Signs BP (!) 141/70 (BP Location: Left Arm)   Pulse 75   Temp 98.6 F (37 C) (Oral)   Ht '5\' 9"'$  (1.753 m)   Wt 90.3 kg   SpO2 96%   BMI 29.39 kg/m   Visual Acuity Right Eye Distance:   Left Eye Distance:   Bilateral Distance:    Right Eye Near:   Left Eye Near:    Bilateral Near:     Physical Exam GEN:     alert, non-ill appearing male in no distress     HENT:  mucus membranes moist, oropharyngeal  without lesions or  exudate, no  tonsillar hypertrophy,   mild oropharyngeal erythema , no nasal discharge,  bilateral TM  normal EYES:   pupils equal and reactive, EOMi ,  no scleral injection NECK:  normal ROM RESP:  no increased work  of breathing,  clear to auscultation bilaterally CVS:   regular rate  and rhythm Skin:   warm and dry, no rash on visible skin  , normal  skin turgor    UC Treatments / Results  Labs (all labs ordered are listed, but only abnormal results are displayed) Labs Reviewed  RESP PANEL BY RT-PCR (FLU A&B, COVID) ARPGX2 - Abnormal; Notable for the following components:      Result Value   SARS Coronavirus 2 by RT PCR POSITIVE (*)    All other components within normal limits    EKG   Radiology No results found.  Procedures Procedures (including critical care time)  Medications Ordered in UC Medications - No data to display  Initial Impression / Assessment and Plan / UC Course  I have reviewed the triage vital signs and the nursing notes.  Pertinent labs & imaging results that were available during my care of the patient were reviewed by me and considered in my medical decision making (see chart for details).       COVID exposure Patient is a 88 y.o. male with history of HTN, CVA who presents after developing symptoms yesterday.  Patient's wife tested positive for COVID over the weekend.  He is COVID vaccinated.  Son is unsure if he has boosted.  Overall he is well-appearing, well-hydrated and in no respiratory distress.  Discussed symptomatic treatment. Pulmonary exam she has equal aeration bilaterally, imaging deferred.  COVID test is positive.  He is  interested in treatment for COVID. After shared decision making with his sone he is interested in [place].  Rx sent to pharmacy.  Quarantine instructions provided.  ED and return precautions and understanding voiced. Discussed MDM, treatment plan and plan for follow-up with patient/parent who agrees with plan.     Final Clinical Impressions(s) / UC Diagnoses   Final diagnoses:  Close exposure to COVID-19 virus  COVID-19     Discharge Instructions      You were exposed to COVID and has symptoms. Please continue  isolation at home for at least 5 days since the start of your symptoms. Once you complete your 5 day quarantine, you may return to normal activities as long  as you've not had a fever for over 24 hours(without taking fever reducing medicine) and your symptoms are improving. Be sure to wear a mask until Day 11.   Please continue good preventive care measures, including:  frequent hand-washing, avoid touching your face, cover coughs/sneezes, stay out of crowds and keep a 6 foot distance from others.  Go to the nearest hospital emergency room if fever/cough/breathlessness are severe or illness seems like a threat to life.      ED Prescriptions     Medication Sig Dispense Auth. Provider   molnupiravir EUA (LAGEVRIO) 200 mg CAPS capsule Take 4 capsules (800 mg total) by mouth 2 (two) times daily for 5 days. 40 capsule Kinesha Auten, DO      PDMP not reviewed this encounter.   Lyndee Hensen, DO 07/04/22 1057

## 2022-10-03 ENCOUNTER — Inpatient Hospital Stay: Payer: Medicare Other

## 2022-10-03 ENCOUNTER — Encounter: Payer: Self-pay | Admitting: Oncology

## 2022-10-03 ENCOUNTER — Inpatient Hospital Stay: Payer: Medicare Other | Attending: Oncology | Admitting: Oncology

## 2022-10-03 DIAGNOSIS — C61 Malignant neoplasm of prostate: Secondary | ICD-10-CM | POA: Insufficient documentation

## 2022-10-03 DIAGNOSIS — I10 Essential (primary) hypertension: Secondary | ICD-10-CM | POA: Insufficient documentation

## 2022-10-03 DIAGNOSIS — Z7982 Long term (current) use of aspirin: Secondary | ICD-10-CM | POA: Insufficient documentation

## 2022-10-03 DIAGNOSIS — Z79899 Other long term (current) drug therapy: Secondary | ICD-10-CM | POA: Diagnosis not present

## 2022-10-03 DIAGNOSIS — Z8673 Personal history of transient ischemic attack (TIA), and cerebral infarction without residual deficits: Secondary | ICD-10-CM | POA: Insufficient documentation

## 2022-10-03 DIAGNOSIS — Z87442 Personal history of urinary calculi: Secondary | ICD-10-CM | POA: Diagnosis not present

## 2022-10-03 LAB — COMPREHENSIVE METABOLIC PANEL
ALT: 36 U/L (ref 0–44)
AST: 39 U/L (ref 15–41)
Albumin: 4.2 g/dL (ref 3.5–5.0)
Alkaline Phosphatase: 146 U/L — ABNORMAL HIGH (ref 38–126)
Anion gap: 8 (ref 5–15)
BUN: 24 mg/dL — ABNORMAL HIGH (ref 8–23)
CO2: 26 mmol/L (ref 22–32)
Calcium: 9.1 mg/dL (ref 8.9–10.3)
Chloride: 107 mmol/L (ref 98–111)
Creatinine, Ser: 1.08 mg/dL (ref 0.61–1.24)
GFR, Estimated: >60 mL/min (ref >60)
Glucose, Bld: 100 mg/dL — ABNORMAL HIGH (ref 70–99)
Potassium: 4 mmol/L (ref 3.5–5.1)
Sodium: 141 mmol/L (ref 135–145)
Total Bilirubin: 1.1 mg/dL (ref 0.3–1.2)
Total Protein: 7.6 g/dL (ref 6.5–8.1)

## 2022-10-03 LAB — CBC WITH DIFFERENTIAL/PLATELET
Abs Immature Granulocytes: 0.07 10*3/uL (ref 0.00–0.07)
Basophils Absolute: 0.1 10*3/uL (ref 0.0–0.1)
Basophils Relative: 1 %
Eosinophils Absolute: 0.2 10*3/uL (ref 0.0–0.5)
Eosinophils Relative: 3 %
HCT: 45.4 % (ref 39.0–52.0)
Hemoglobin: 16.3 g/dL (ref 13.0–17.0)
Immature Granulocytes: 1 %
Lymphocytes Relative: 32 %
Lymphs Abs: 2.4 10*3/uL (ref 0.7–4.0)
MCH: 32.1 pg (ref 26.0–34.0)
MCHC: 35.9 g/dL (ref 30.0–36.0)
MCV: 89.4 fL (ref 80.0–100.0)
Monocytes Absolute: 0.8 10*3/uL (ref 0.1–1.0)
Monocytes Relative: 10 %
Neutro Abs: 4 10*3/uL (ref 1.7–7.7)
Neutrophils Relative %: 53 %
Platelets: 199 10*3/uL (ref 150–400)
RBC: 5.08 MIL/uL (ref 4.22–5.81)
RDW: 12.4 % (ref 11.5–15.5)
WBC: 7.4 10*3/uL (ref 4.0–10.5)
nRBC: 0 % (ref 0.0–0.2)

## 2022-10-03 LAB — PSA: Prostatic Specific Antigen: 3.23 ng/mL (ref 0.00–4.00)

## 2022-10-03 NOTE — Progress Notes (Signed)
Hematology/Oncology Consult note [facility] Telephone:([phone] [phone]  Patient Care Team: Baxter Hire, MD as PCP - General (Internal Medicine)   Name of the patient: Darryl Roman  [id number]  December 27, 1932    Reason for referral- new diagnosis of prostate cancer   Referring physician- Dr. Rogers Blocker  Date of visit: 10/03/22   History of presenting illness- Patient is a 89 year old male who was diagnosed with prostate adenocarcinoma T1c Gleason's grade 3+30 in 2009 and was treated with HIFU.  He had a PSA recurrence in 2017 when his PSA went up to 4.5.  He had a repeat biopsy at that time which showed a Gleason's grade 3+3 adenocarcinoma involving 1 out of 12 core biopsies.  He was then been on active surveillance since then and PSA has been around 4.7.  He is not presently on any treatment for his prostate cancer.    PSA 4.7 in December 2021, PSA on 03/22/2021 was 4.5.  4.7 in December 2022 6.5 in June 2023 4.2 in December 2023  Patient is doing well overall for his age.  Appetite and weight have remained stable.  Denies any new aches and pains anywhere  ECOG PS- 1  Pain scale- 0   Review of systems- Review of Systems  Constitutional:  Negative for chills, fever, malaise/fatigue and weight loss.  HENT:  Negative for congestion, ear discharge and nosebleeds.   Eyes:  Negative for blurred vision.  Respiratory:  Negative for cough, hemoptysis, sputum production, shortness of breath and wheezing.   Cardiovascular:  Negative for chest pain, palpitations, orthopnea and claudication.  Gastrointestinal:  Negative for abdominal pain, blood in stool, constipation, diarrhea, heartburn, melena, nausea and vomiting.  Genitourinary:  Negative for dysuria, flank pain, frequency, hematuria and urgency.  Musculoskeletal:  Negative for back pain, joint pain and myalgias.  Skin:  Negative for rash.  Neurological:  Negative for dizziness, tingling, focal  weakness, seizures, weakness and headaches.  Endo/Heme/Allergies:  Does not bruise/bleed easily.  Psychiatric/Behavioral:  Negative for depression and suicidal ideas. The patient does not have insomnia.     No Known Allergies  There are no problems to display for this patient.    Past Medical History:  Diagnosis Date   History of kidney stones    Hypertension    Kidney stones    Prostate cancer ([place])    Stroke [facility])      Past Surgical History:  Procedure Laterality Date   KIDNEY STONE SURGERY     MASS EXCISION Left 10/14/2019   Procedure: EXCISION SOFT TISSUE MAS OF PALM;  Surgeon: Corky Mull, MD;  Location: [facility] ORS;  Service: Orthopedics;  Laterality: Left;   SHOULDER SURGERY      Social History   Socioeconomic History   Marital status: Married    Spouse name: Not on file   Number of children: Not on file   Years of education: Not on file   Highest education level: Not on file  Occupational History   Not on file  Tobacco Use   Smoking status: Never   Smokeless tobacco: Never  Vaping Use   Vaping Use: Never used  Substance and Sexual Activity   Alcohol use: No   Drug use: No   Sexual activity: Yes  Other Topics Concern   Not on file  Social History Narrative   Not on file   Social Determinants of Health   Financial Resource Strain: Not on file  Food Insecurity: Not on file  Transportation Needs: Not on file  Physical Activity: Not on file  Stress: Not on file  Social Connections: Not on file  Intimate Partner Violence: Not on file     Family History  Problem Relation Age of Onset   Heart attack Mother      Current Outpatient Medications:    amLODipine (NORVASC) 10 MG tablet, Take 10 mg by mouth daily., Disp: , Rfl:    amLODipine (NORVASC) 10 MG tablet, Take 1 tablet by mouth daily., Disp: , Rfl:    Apoaequorin (PREVAGEN) 10 MG CAPS, Take by mouth., Disp: , Rfl:    aspirin EC 325 MG tablet, Take 325 mg by mouth daily., Disp: , Rfl:     fluticasone (FLONASE) 50 MCG/ACT nasal spray, Place 1 spray into both nostrils at bedtime as needed for allergies or rhinitis., Disp: , Rfl:    losartan (COZAAR) 25 MG tablet, Take 25 mg by mouth daily., Disp: , Rfl:    rosuvastatin (CRESTOR) 5 MG tablet, Take 5 mg by mouth at bedtime., Disp: , Rfl:    sildenafil (REVATIO) 20 MG tablet, Take 3-5 tabs daily as needed., Disp: , Rfl:    vitamin B-12 (CYANOCOBALAMIN) 1000 MCG tablet, Take 1,000 mcg by mouth daily., Disp: , Rfl:    Physical exam: There were no vitals filed for this visit. Physical Exam Cardiovascular:     Rate and Rhythm: Normal rate and regular rhythm.     Heart sounds: Normal heart sounds.  Pulmonary:     Effort: Pulmonary effort is normal.     Breath sounds: Normal breath sounds.  Abdominal:     General: Bowel sounds are normal.     Palpations: Abdomen is soft.  Skin:    General: Skin is warm and dry.  Neurological:     Mental Status: He is alert and oriented to person, place, and time.           Latest Ref Rng & Units 05/07/2019    2:51 PM  CMP  Glucose 70 - 99 mg/dL 139   BUN 8 - 23 mg/dL 21   Creatinine 0.61 - 1.24 mg/dL 1.07   Sodium 135 - 145 mmol/L 141   Potassium 3.5 - 5.1 mmol/L 3.7   Chloride 98 - 111 mmol/L 109   CO2 22 - 32 mmol/L 23   Calcium 8.9 - 10.3 mg/dL 8.7   Total Protein 6.5 - 8.1 g/dL 7.2   Total Bilirubin 0.3 - 1.2 mg/dL 1.2   Alkaline Phos 38 - 126 U/L 157   AST 15 - 41 U/L 41   ALT 0 - 44 U/L 34       Latest Ref Rng & Units 05/07/2019    2:51 PM  CBC  WBC 4.0 - 10.5 K/uL 6.6   Hemoglobin 13.0 - 17.0 g/dL 15.7   Hematocrit 39.0 - 52.0 % 44.2   Platelets 150 - 400 K/uL 181     No images are attached to the encounter.  No results found.  Assessment and plan- Patient is a 89 y.o. male with history of T1c Gleason's 3+3 adenocarcinoma of the prostate diagnosed in 2009 presently on active surveillance referred for further management  Looking back at PSA value since 2021  patient's PSA has fluctuated between 4.5-4.7.  It did transiently go up to 6.5 in June 2023 but went down back to 4.2 in December 2023.  Overall there has been no consistent increase in his PSA values over the last 2 years.  I  will plan to obtain another PSA at this time and continue to watch his PSA every 3 months moving forward and see him back in 6 months.  If there is a consistent increase in his PSA values I will consider getting systemic scans at that time   Thank you for this kind referral and the opportunity to participate in the care of this patient   Visit Diagnosis 1. Prostate cancer [facility])     Dr. Randa Evens, MD, MPH [facility] [phone] 10/03/2022

## 2022-10-03 NOTE — Progress Notes (Signed)
Pt referred by Dr Rogers Blocker, pt has a history of Prostate cancer and was first diagnosed in 2009. Reports most recent PSA was "normal".

## 2022-10-05 ENCOUNTER — Telehealth: Payer: Self-pay | Admitting: *Deleted

## 2022-10-05 NOTE — Telephone Encounter (Signed)
Called and left message that psa was 3.23 and that is normal level. He still needs to make his lab appt in  4/3 at 1:30. The next appt  7/3 1:45 with labs and see md. Left my direct number to call if needed

## 2022-10-05 NOTE — Telephone Encounter (Signed)
-----   Message from Sindy Guadeloupe, MD sent at 10/04/2022  8:41 AM EST ----- PSA is stable. Nothing to do at this time. Keep f/u as scheduled. Please let patient know when you return

## 2022-10-06 ENCOUNTER — Other Ambulatory Visit: Payer: Self-pay

## 2022-10-06 ENCOUNTER — Emergency Department
Admission: EM | Admit: 2022-10-06 | Discharge: 2022-10-07 | Disposition: A | Discharge status: Home / Self Care | Payer: Medicare Other | Attending: Emergency Medicine | Admitting: Emergency Medicine

## 2022-10-06 ENCOUNTER — Emergency Department: Payer: Medicare Other

## 2022-10-06 DIAGNOSIS — Z23 Encounter for immunization: Secondary | ICD-10-CM | POA: Diagnosis not present

## 2022-10-06 DIAGNOSIS — Z8546 Personal history of malignant neoplasm of prostate: Secondary | ICD-10-CM | POA: Insufficient documentation

## 2022-10-06 DIAGNOSIS — S01511A Laceration without foreign body of lip, initial encounter: Secondary | ICD-10-CM | POA: Diagnosis not present

## 2022-10-06 DIAGNOSIS — I10 Essential (primary) hypertension: Secondary | ICD-10-CM | POA: Insufficient documentation

## 2022-10-06 DIAGNOSIS — S0990XA Unspecified injury of head, initial encounter: Secondary | ICD-10-CM | POA: Insufficient documentation

## 2022-10-06 DIAGNOSIS — M25511 Pain in right shoulder: Secondary | ICD-10-CM | POA: Insufficient documentation

## 2022-10-06 DIAGNOSIS — Z79899 Other long term (current) drug therapy: Secondary | ICD-10-CM | POA: Insufficient documentation

## 2022-10-06 DIAGNOSIS — W010XXA Fall on same level from slipping, tripping and stumbling without subsequent striking against object, initial encounter: Secondary | ICD-10-CM | POA: Diagnosis not present

## 2022-10-06 DIAGNOSIS — Z7982 Long term (current) use of aspirin: Secondary | ICD-10-CM | POA: Diagnosis not present

## 2022-10-06 DIAGNOSIS — W19XXXA Unspecified fall, initial encounter: Secondary | ICD-10-CM

## 2022-10-06 NOTE — ED Triage Notes (Signed)
Pt arrives via POV with CC of mechanical fall. Pt has laceration to upper lip. Pt denies losing consciousness. Denies dizziness, SOB, and lightheadedness at this time. Denies use of coumadin or warfarin. Pt is alert and oriented to person, place, and situation at this time. Disoriented to time initially but able to recall correct year on second attempt.

## 2022-10-06 NOTE — ED Notes (Signed)
First Nurse Note: Pt arrives via POV from home with complaints of fall and lip laceration. Pt not on blood thinners. No LOC - tripped and landed on concrete.

## 2022-10-07 ENCOUNTER — Emergency Department: Payer: Medicare Other

## 2022-10-07 DIAGNOSIS — S0990XA Unspecified injury of head, initial encounter: Secondary | ICD-10-CM | POA: Diagnosis not present

## 2022-10-07 MED ORDER — BACITRACIN ZINC 500 UNIT/GM EX OINT
TOPICAL_OINTMENT | Freq: Once | CUTANEOUS | Status: AC
  Filled 2022-10-07: qty 1.8

## 2022-10-07 MED ORDER — AMOXICILLIN-POT CLAVULANATE 875-125 MG PO TABS: 1.0000 | ORAL_TABLET | Freq: Two times a day (BID) | ORAL | 0 refills | Status: DC

## 2022-10-07 MED ORDER — TETANUS-DIPHTH-ACELL PERTUSSIS 5-2.5-18.5 LF-MCG/0.5 IM SUSY
0.5000 mL | PREFILLED_SYRINGE | Freq: Once | INTRAMUSCULAR | Status: AC
  Administered 2022-10-07: 0.5 mL via INTRAMUSCULAR
  Filled 2022-10-07: qty 0.5

## 2022-10-07 MED ORDER — LIDOCAINE HCL (PF) 1 % IJ SOLN
5.0000 mL | Freq: Once | INTRAMUSCULAR | Status: AC
  Administered 2022-10-07: 5 mL via INTRADERMAL
  Filled 2022-10-07: qty 5

## 2022-10-07 MED ORDER — AMOXICILLIN-POT CLAVULANATE 875-125 MG PO TABS
1.0000 | ORAL_TABLET | Freq: Once | ORAL | Status: AC
  Administered 2022-10-07: 1 via ORAL
  Filled 2022-10-07: qty 1

## 2022-10-07 MED ORDER — CHLORHEXIDINE GLUCONATE 0.12 % MT SOLN: 15.0000 mL | Freq: Two times a day (BID) | OROMUCOSAL | 0 refills | Status: AC

## 2022-10-07 NOTE — ED Provider Notes (Signed)
[facility] Provider Note    Event Date/Time   First MD Initiated Contact with Patient 10/06/22 2359     (approximate)   History   Fall   HPI  Darryl Roman is a 89 y.o. male with history of hypertension, prostate cancer, CVA who presents to the emergency department after he tripped and fell today.  Did hit his head but no loss of consciousness and not on blood thinners.  Has a laceration to the upper left lip.  It also appears that he broke the distal portion of his left lateral incisor.  Complaining of right shoulder pain.  No other injury.  No neck or back pain.  No chest or abdominal pain.  No preceding symptoms that led to his fall such as chest pain, shortness of breath, dizziness, palpitations.   History provided by patient, wife, son.    Past Medical History:  Diagnosis Date   History of kidney stones    Hypertension    Kidney stones    Prostate cancer ([place])    Stroke [facility])     Past Surgical History:  Procedure Laterality Date   KIDNEY STONE SURGERY     MASS EXCISION Left 10/14/2019   Procedure: EXCISION SOFT TISSUE MAS OF PALM;  Surgeon: Corky Mull, MD;  Location: [facility] ORS;  Service: Orthopedics;  Laterality: Left;   SHOULDER SURGERY      MEDICATIONS:  Prior to Admission medications   Medication Sig Start Date End Date Taking? Authorizing Provider  amLODipine (NORVASC) 10 MG tablet Take 10 mg by mouth daily.    [provider]  amLODipine (NORVASC) 10 MG tablet Take 1 tablet by mouth daily. 08/13/22   [provider]  Apoaequorin (PREVAGEN) 10 MG CAPS Take by mouth.    [provider]  aspirin EC 325 MG tablet Take 325 mg by mouth daily.    [provider]  fluticasone (FLONASE) 50 MCG/ACT nasal spray Place 1 spray into both nostrils at bedtime as needed for allergies or rhinitis.    [provider]  losartan (COZAAR) 25 MG tablet Take 25 mg by mouth daily.    [provider]   rosuvastatin (CRESTOR) 5 MG tablet Take 5 mg by mouth at bedtime.    [provider]  sildenafil (REVATIO) 20 MG tablet Take 3-5 tabs daily as needed. 06/13/21   [provider]  vitamin B-12 (CYANOCOBALAMIN) 1000 MCG tablet Take 1,000 mcg by mouth daily.    [provider]    Physical Exam   Triage Vital Signs: ED Triage Vitals  Enc Vitals Group     BP 10/06/22 2023 (!) 155/85     Pulse Rate 10/06/22 2023 84     Resp 10/06/22 2023 18     Temp 10/06/22 2023 97.8 F (36.6 C)     Temp Source 10/06/22 2023 Oral     SpO2 10/06/22 2023 95 %     Weight 10/06/22 2024 200 lb (90.7 kg)     Height 10/06/22 2024 '5\' 9"'$  (1.753 m)     Head Circumference --      Peak Flow --      Pain Score 10/06/22 2030 0     Pain Loc --      Pain Edu? --      Excl. in [place]? --     Most recent vital signs: Vitals:   10/06/22 2356 10/07/22 0134  BP: 137/74 (!) 140/78  Pulse: 75 78  Resp: 18 18  Temp:    SpO2: 95% 96%     CONSTITUTIONAL: Alert and oriented and responds appropriately to questions. Well-appearing; well-nourished; GCS 15 HEAD: Normocephalic; atraumatic EYES: Conjunctivae clear, PERRL, EOMI ENT: normal nose; no rhinorrhea; moist mucous membranes; pharynx without lesions noted; no septal hematoma, no epistaxis; no facial deformity or bony tenderness, 3 cm laceration to the left upper lip that is through and through.  He has fracture of the distal portion of his left lateral upper incisor.  No other dental injury noted. NECK: Supple, no midline spinal tenderness, step-off or deformity; trachea midline CARD: RRR; S1 and S2 appreciated; no murmurs, no clicks, no rubs, no gallops RESP: Normal chest excursion without splinting or tachypnea; breath sounds clear and equal bilaterally; no wheezes, no rhonchi, no rales; no hypoxia or respiratory distress CHEST:  chest wall stable, no crepitus or ecchymosis or deformity, nontender to palpation; no flail chest ABD/GI: Normal  bowel sounds; non-distended; soft, non-tender, no rebound, no guarding; no ecchymosis or other lesions noted PELVIS:  stable, nontender to palpation BACK:  The back appears normal; no midline spinal tenderness, step-off or deformity EXT: Normal ROM in all joints; non-tender to palpation; no edema; normal capillary refill; no cyanosis, no bony tenderness or bony deformity of patient's extremities, no joint effusion, compartments are soft, extremities are warm and well-perfused, no ecchymosis SKIN: Normal color for age and race; warm NEURO: No facial asymmetry, normal speech, moving all extremities equally  ED Results / Procedures / Treatments   LABS: (all labs ordered are listed, but only abnormal results are displayed) Labs Reviewed - No data to display   EKG:   RADIOLOGY: My personal review and interpretation of imaging: CT scan show no acute traumatic injury.  I have personally reviewed all radiology reports. DG Shoulder Right  Result Date: 10/07/2022 CLINICAL DATA:  Fall, right shoulder pain EXAM: RIGHT SHOULDER - 2+ VIEW COMPARISON:  None Available. FINDINGS: There is superior subluxation of the humeral head in keeping with underlying rotator cuff degeneration. No acute fracture. No dislocation. Mild acromioclavicular and glenohumeral degenerative arthritis. Limited evaluation of the right hemithorax is unremarkable. IMPRESSION: 1. No acute fracture or dislocation. 2. Rotator cuff degeneration. Electronically Signed   By: Fidela Salisbury M.D.   On: 10/07/2022 00:41   CT Cervical Spine Wo Contrast  Result Date: 10/06/2022 CLINICAL DATA:  Trauma. EXAM: CT CERVICAL SPINE WITHOUT CONTRAST TECHNIQUE: Multidetector CT imaging of the cervical spine was performed without intravenous contrast. Multiplanar CT image reconstructions were also generated. RADIATION DOSE REDUCTION: This exam was performed according to the departmental dose-optimization program which includes automated exposure control,  adjustment of the mA and/or kV according to patient size and/or use of iterative reconstruction technique. COMPARISON:  None Available. FINDINGS: Alignment: Normal. Skull base and vertebrae: No acute fracture. No primary bone lesion or focal pathologic process. Soft tissues and spinal canal: No prevertebral fluid or swelling. No visible canal hematoma. Disc levels: There is disc space narrowing throughout the cervical spine compatible with degenerative change most significant at C4-C5 and C6-C7. There is mild neural foraminal stenosis bilaterally at these levels. There is also mild central canal stenosis at C5-C6 secondary to posterior disc osteophyte complex. Upper chest: Negative. Other: None. IMPRESSION: 1. No acute fracture or traumatic subluxation of the cervical spine. 2. Degenerative changes of the cervical spine. Electronically Signed   By: Ronney Asters M.D.   On: 10/06/2022 21:30   CT HEAD WO CONTRAST (5MM)  Result Date: 10/06/2022 CLINICAL  DATA:  Head trauma EXAM: CT HEAD WITHOUT CONTRAST TECHNIQUE: Contiguous axial images were obtained from the base of the skull through the vertex without intravenous contrast. RADIATION DOSE REDUCTION: This exam was performed according to the departmental dose-optimization program which includes automated exposure control, adjustment of the mA and/or kV according to patient size and/or use of iterative reconstruction technique. COMPARISON:  CT head 05/07/2019.  MRI brain 09/02/2012. FINDINGS: Brain: No evidence of acute infarction, hemorrhage, hydrocephalus, acute extra-axial fluid collection or mass effect. CSF attenuation space in the right middle cranial fossa measuring 4.5 by 3.2 cm is unchanged most compatible with arachnoid cyst. Encephalomalacia in the right occipital lobe is compatible with prior infarct and unchanged. There is stable mild patchy periventricular and deep white matter hypodensity, likely chronic small vessel ischemic change. Vascular:  Atherosclerotic calcifications are present within the cavernous internal carotid arteries. Skull: Normal. Negative for fracture or focal lesion. Sinuses/Orbits: No acute finding. Patient is status post sinonasal surgery bilaterally. Other: None. IMPRESSION: 1. No acute intracranial process. Electronically Signed   By: Ronney Asters M.D.   On: 10/06/2022 21:27     PROCEDURES:  Critical Care performed: No   LACERATION REPAIR Performed by: Cyril Mourning Suraiya Dickerson Authorized by: Cyril Mourning Maddox Hlavaty Consent: Verbal consent obtained. Risks and benefits: risks, benefits and alternatives were discussed Consent given by: patient Patient identity confirmed: provided demographic data Prepped and Draped in normal sterile fashion Wound explored  Laceration Location: Left upper lip  Laceration Length: 3cm  No Foreign Bodies seen or palpated  Anesthesia: local infiltration  Local anesthetic: lidocaine 1% without epinephrine  Anesthetic total: 4 ml  Irrigation method: syringe Amount of cleaning: standard  Skin closure: Superficial  Number of sutures: 3  Technique: Area anesthetized using lidocaine 1% without epinephrine. Wound irrigated copiously with sterile saline. Wound then cleaned with Betadine and draped in sterile fashion. Wound closed using 3 simple interrupted sutures with 5-0 Vicryl.  Bacitracin applied. Good wound approximation and hemostasis achieved.    Patient tolerance: Patient tolerated the procedure well with no immediate complications.     Procedures    IMPRESSION / MDM / ASSESSMENT AND PLAN / ED COURSE  I reviewed the triage vital signs and the nursing notes.  Patient here with mechanical fall with lip laceration, right shoulder pain.     DIFFERENTIAL DIAGNOSIS (includes but not limited to):   Intracranial hemorrhage, skull fracture, cervical spine fracture, concussion, lip laceration, shoulder sprain, shoulder fracture, shoulder dislocation  Patient's presentation is most  consistent with acute presentation with potential threat to life or bodily function.  PLAN: CT of the head and cervical spine obtained from triage and reviewed/interpreted by myself and the radiologist and shows no acute abnormality.  Will update his tetanus vaccine and repair the laceration to his lip.  Will obtain x-rays of the right shoulder.  Patient declines any pain medication.  Denies any other injury.  Hemodynamically stable, neurologically intact.   MEDICATIONS GIVEN IN ED: Medications  lidocaine (PF) (XYLOCAINE) 1 % injection 5 mL (5 mLs Intradermal Given 10/07/22 0128)  bacitracin ointment ( Topical Given 10/07/22 0026)  Tdap (BOOSTRIX) injection 0.5 mL (0.5 mLs Intramuscular Given 10/07/22 0027)  amoxicillin-clavulanate (AUGMENTIN) 875-125 MG per tablet 1 tablet (1 tablet Oral Given 10/07/22 0129)     ED COURSE: X-ray of the left shoulder reviewed and interpreted by myself and the radiologist and shows no acute abnormality.  Patient tolerated laceration repair without difficulty.  Discussed supportive care instructions, wound care instructions, return precautions.  Will discharge with Augmentin and Peridex given wound was through and through but did not need any repair on the inside of the mouth with I discussed with family that this wound inside of the lip would heal very quickly.  Will have him follow-up with his dentist for his dental fracture.  He is not having any dental pain and there is no exposure of dentin.   At this time, I do not feel there is any life-threatening condition present. I reviewed all nursing notes, vitals, pertinent previous records.  All lab and urine results, EKGs, imaging ordered have been independently reviewed and interpreted by myself.  I reviewed all available radiology reports from any imaging ordered this visit.  Based on my assessment, I feel the patient is safe to be discharged home without further emergent workup and can continue workup as an outpatient as  needed. Discussed all findings, treatment plan as well as usual and customary return precautions.  They verbalize understanding and are comfortable with this plan.  Outpatient follow-up has been provided as needed.  All questions have been answered.    CONSULTS:  none   OUTSIDE RECORDS REVIEWED: Reviewed last internal medicine note with Meade Maw on 09/26/2022.       FINAL CLINICAL IMPRESSION(S) / ED DIAGNOSES   Final diagnoses:  Fall, initial encounter  Injury of head, initial encounter  Lip laceration, initial encounter     Rx / DC Orders   ED Discharge Orders          Ordered    amoxicillin-clavulanate (AUGMENTIN) 875-125 MG tablet  2 times daily        10/07/22 0118    chlorhexidine (PERIDEX) 0.12 % solution  2 times daily        10/07/22 0118             Note:  This document was prepared using Dragon voice recognition software and may include unintentional dictation errors.   Lizania Bouchard, Delice Bison, DO 10/07/22 [phone]

## 2022-10-07 NOTE — Discharge Instructions (Addendum)
You may take over-the-counter Tylenol 1000 mg every 6 hours as needed for pain.  The lacerations in your lip will come out on their own in the next 1 to 2 weeks.  You may clean this wound gently with warm soap and water.  You may apply Neosporin daily.  Please take your antibiotics until complete.  Please use your mouthwash twice daily.  You may follow-up with your dentist for your left lateral incisor injury.

## 2023-01-02 ENCOUNTER — Inpatient Hospital Stay: Payer: Medicare Other | Attending: Oncology

## 2023-01-02 DIAGNOSIS — C61 Malignant neoplasm of prostate: Secondary | ICD-10-CM | POA: Insufficient documentation

## 2023-01-02 LAB — PSA: Prostatic Specific Antigen: 4.23 ng/mL — ABNORMAL HIGH (ref 0.00–4.00)

## 2023-01-16 ENCOUNTER — Telehealth: Payer: Self-pay | Admitting: *Deleted

## 2023-01-16 NOTE — Telephone Encounter (Signed)
Wife called to see what psa was and it was 4.23. I told her that the last note in Jan she put in that pt has been to 6 before several years ago. She also states that it going up quickly she may have to have scan done. I told wife I will check with Smith Robert tomorrow and let her and pt. What to do from here. Wife was thankful for the call.

## 2023-04-03 ENCOUNTER — Ambulatory Visit: Payer: Medicare Other | Admitting: Oncology

## 2023-04-03 ENCOUNTER — Other Ambulatory Visit: Payer: Medicare Other

## 2023-04-11 ENCOUNTER — Encounter: Payer: Self-pay | Admitting: Oncology

## 2023-04-11 ENCOUNTER — Inpatient Hospital Stay (HOSPITAL_BASED_OUTPATIENT_CLINIC_OR_DEPARTMENT_OTHER): Payer: Medicare Other | Admitting: Oncology

## 2023-04-11 ENCOUNTER — Inpatient Hospital Stay: Payer: Medicare Other | Attending: Oncology

## 2023-04-11 VITALS — BP 143/69 | HR 72 | Temp 98.6°F | Resp 17 | Wt 198.3 lb

## 2023-04-11 DIAGNOSIS — C61 Malignant neoplasm of prostate: Secondary | ICD-10-CM | POA: Insufficient documentation

## 2023-04-11 LAB — PSA: Prostatic Specific Antigen: 4.42 ng/mL — ABNORMAL HIGH (ref 0.00–4.00)

## 2023-04-12 ENCOUNTER — Telehealth: Payer: Self-pay | Admitting: *Deleted

## 2023-04-12 NOTE — Telephone Encounter (Signed)
I called the house and the wife answered the phone. Wife says he does not hear good on the phone . She took the psa level 4.4. it is stable at this time. He will need to continuie to get psa done every 3 months. Wife is good and when tell her husband.

## 2023-04-12 NOTE — Telephone Encounter (Signed)
-----   Message from Creig Hines sent at 04/12/2023  8:44 AM EDT ----- Please let him know psa is 4.4 and overall stable as compared to before. Continue Q3 month monitoring of his psa. Nothing more to do at this time

## 2023-04-14 NOTE — Progress Notes (Signed)
Hematology/Oncology Consult note [facility]  Telephone:([phone] [phone]  Patient Care Team: Gracelyn Nurse, MD as PCP - General (Internal Medicine)   Name of the patient: Darryl Vangelder  [id number]  July 04, 1933   Date of visit: 04/14/23  Diagnosis-metastatic prostate cancer currently under observation  Chief complaint/ Reason for visit-routine follow-up of prostate cancer  Heme/Onc history: Patient is a 89 year old male who was diagnosed with prostate adenocarcinoma T1c Gleason's grade 3+30 in 2009 and was treated with HIFU.  He had a PSA recurrence in 2017 when his PSA went up to 4.5.  He had a repeat biopsy at that time which showed a Gleason's grade 3+3 adenocarcinoma involving 1 out of 12 core biopsies.  He was then been on active surveillance since then and PSA has been around 4.7.  He is not presently on any treatment for his prostate cancer.     PSA 4.7 in December 2021, PSA on 03/22/2021 was 4.5.  4.7 in December 2022 6.5 in June 2023 4.2 in December 2023  Interval history-patient is doing well for his age.  Denies any specific complaints at this time.  He has not had any recent hospitalizations.  Denies any new aches and pains anywhere  ECOG PS- 1 Pain scale- 0   Review of systems- Review of Systems  Constitutional:  Negative for chills, fever, malaise/fatigue and weight loss.  HENT:  Negative for congestion, ear discharge and nosebleeds.   Eyes:  Negative for blurred vision.  Respiratory:  Negative for cough, hemoptysis, sputum production, shortness of breath and wheezing.   Cardiovascular:  Negative for chest pain, palpitations, orthopnea and claudication.  Gastrointestinal:  Negative for abdominal pain, blood in stool, constipation, diarrhea, heartburn, melena, nausea and vomiting.  Genitourinary:  Negative for dysuria, flank pain, frequency, hematuria and urgency.  Musculoskeletal:  Negative for back pain, joint pain and  myalgias.  Skin:  Negative for rash.  Neurological:  Negative for dizziness, tingling, focal weakness, seizures, weakness and headaches.  Endo/Heme/Allergies:  Does not bruise/bleed easily.  Psychiatric/Behavioral:  Negative for depression and suicidal ideas. The patient does not have insomnia.       No Known Allergies   Past Medical History:  Diagnosis Date   History of kidney stones    Hypertension    Kidney stones    Prostate cancer ([facility])    Stroke [facility])      Past Surgical History:  Procedure Laterality Date   KIDNEY STONE SURGERY     MASS EXCISION Left 10/14/2019   Procedure: EXCISION SOFT TISSUE MAS OF PALM;  Surgeon: Christena Flake, MD;  Location: [facility] ORS;  Service: Orthopedics;  Laterality: Left;   SHOULDER SURGERY      Social History   Socioeconomic History   Marital status: Married    Spouse name: Not on file   Number of children: Not on file   Years of education: Not on file   Highest education level: Not on file  Occupational History   Not on file  Tobacco Use   Smoking status: Never   Smokeless tobacco: Never  Vaping Use   Vaping status: Never Used  Substance and Sexual Activity   Alcohol use: No   Drug use: No   Sexual activity: Yes  Other Topics Concern   Not on file  Social History Narrative   Not on file   Social Determinants of Health   Financial Resource Strain: Not on file  Food Insecurity: Not on file  Transportation Needs: Not on file  Physical Activity: Not on file  Stress: Not on file  Social Connections: Not on file  Intimate Partner Violence: Not on file    Family History  Problem Relation Age of Onset   Heart attack Mother      Current Outpatient Medications:    amLODipine (NORVASC) 10 MG tablet, Take 10 mg by mouth daily., Disp: , Rfl:    amLODipine (NORVASC) 10 MG tablet, Take 1 tablet by mouth daily., Disp: , Rfl:    Apoaequorin (PREVAGEN) 10 MG CAPS, Take by mouth., Disp: , Rfl:    aspirin EC 325 MG tablet, Take  325 mg by mouth daily., Disp: , Rfl:    fluticasone (FLONASE) 50 MCG/ACT nasal spray, Place 1 spray into both nostrils at bedtime as needed for allergies or rhinitis., Disp: , Rfl:    losartan (COZAAR) 25 MG tablet, Take 25 mg by mouth daily., Disp: , Rfl:    rosuvastatin (CRESTOR) 5 MG tablet, Take 5 mg by mouth at bedtime., Disp: , Rfl:    sildenafil (REVATIO) 20 MG tablet, Take 3-5 tabs daily as needed., Disp: , Rfl:    vitamin B-12 (CYANOCOBALAMIN) 1000 MCG tablet, Take 1,000 mcg by mouth daily., Disp: , Rfl:    amoxicillin-clavulanate (AUGMENTIN) 875-125 MG tablet, Take 1 tablet by mouth 2 (two) times daily. (Patient not taking: Reported on 04/11/2023), Disp: 14 tablet, Rfl: 0  Physical exam:  Vitals:   04/11/23 1351  BP: (!) 143/69  Pulse: 72  Resp: 17  Temp: 98.6 F (37 C)  SpO2: 94%  Weight: 198 lb 4.8 oz (89.9 kg)   Physical Exam Cardiovascular:     Rate and Rhythm: Normal rate and regular rhythm.     Heart sounds: Normal heart sounds.  Pulmonary:     Effort: Pulmonary effort is normal.     Breath sounds: Normal breath sounds.  Abdominal:     General: Bowel sounds are normal.     Palpations: Abdomen is soft.  Skin:    General: Skin is warm and dry.  Neurological:     Mental Status: He is alert and oriented to person, place, and time.         Latest Ref Rng & Units 10/03/2022   11:20 AM  CMP  Glucose 70 - 99 mg/dL 014   BUN 8 - 23 mg/dL 24   Creatinine 1.03 - 1.24 mg/dL 0.13   Sodium 143 - 888 mmol/L 141   Potassium 3.5 - 5.1 mmol/L 4.0   Chloride 98 - 111 mmol/L 107   CO2 22 - 32 mmol/L 26   Calcium 8.9 - 10.3 mg/dL 9.1   Total Protein 6.5 - 8.1 g/dL 7.6   Total Bilirubin 0.3 - 1.2 mg/dL 1.1   Alkaline Phos 38 - 126 U/L 146   AST 15 - 41 U/L 39   ALT 0 - 44 U/L 36       Latest Ref Rng & Units 10/03/2022   11:20 AM  CBC  WBC 4.0 - 10.5 K/uL 7.4   Hemoglobin 13.0 - 17.0 g/dL 75.7   Hematocrit 97.2 - 52.0 % 45.4   Platelets 150 - 400 K/uL 199       Assessment and plan- Patient is a 89 y.o. male with history of T1c Gleason's 3+3 adenocarcinoma of the prostate diagnosed in 2009 presently on active surveillance. He is here for routine f/u of prostate cancer  Patient has not received any treatment recently for his prostate  cancer.  His PSA remains stable between 4-5.  His PSA today is 4.4 which is not significantly different than what it was a year ago.  No need for surveillance imaging at this time unless the PSA is consistently trending up.  I will continue to monitor his PSA every 3 months and see him back in 6 months   Visit Diagnosis 1. Prostate cancer [facility])      Dr. Owens Shark, MD, MPH [facility] [place] [phone] 04/14/2023 11:34 AM

## 2023-04-15 ENCOUNTER — Encounter: Payer: Self-pay | Admitting: Emergency Medicine

## 2023-04-15 ENCOUNTER — Ambulatory Visit
Admission: EM | Admit: 2023-04-15 | Discharge: 2023-04-15 | Disposition: A | Discharge status: Home / Self Care | Payer: Medicare Other | Attending: Physician Assistant | Admitting: Physician Assistant

## 2023-04-15 DIAGNOSIS — L299 Pruritus, unspecified: Secondary | ICD-10-CM | POA: Diagnosis not present

## 2023-04-15 DIAGNOSIS — R21 Rash and other nonspecific skin eruption: Secondary | ICD-10-CM | POA: Diagnosis not present

## 2023-04-15 MED ORDER — HYDROXYZINE HCL 25 MG PO TABS: 25.0000 mg | ORAL_TABLET | Freq: Four times a day (QID) | ORAL | 0 refills | Status: AC | PRN

## 2023-04-15 MED ORDER — TRIAMCINOLONE ACETONIDE 0.1 % EX CREA: 1.0000 | TOPICAL_CREAM | Freq: Two times a day (BID) | CUTANEOUS | 0 refills | Status: DC

## 2023-04-15 NOTE — ED Triage Notes (Signed)
Pt presents with bilateral arms itching after working in the yard pulling grass 2 days ago.

## 2023-04-15 NOTE — ED Provider Notes (Signed)
[facility]    CSN: [id number] Arrival date & time: 04/15/23  0953      History   Chief Complaint Chief Complaint  Patient presents with   Pruritis    HPI Darryl Roman is a 89 y.o. male presenting for itching of arms and legs for the past couple days.  Patient reports symptoms started after he was pulling grass in his yard.  He has applied hydrocortisone cream without relief.  Wife is present and states he cannot stop scratching.  They have not tried Benadryl.  Unknown if any insect bites but they suspect he might have sustained ant bites.  No reported swelling or breathing difficulty.  HPI  Past Medical History:  Diagnosis Date   History of kidney stones    Hypertension    Kidney stones    Prostate cancer (HCC)    Stroke [facility])     Patient Active Problem List   Diagnosis Date Noted   Prostate cancer (HCC) 10/03/2022    Past Surgical History:  Procedure Laterality Date   KIDNEY STONE SURGERY     MASS EXCISION Left 10/14/2019   Procedure: EXCISION SOFT TISSUE MAS OF PALM;  Surgeon: Christena Flake, MD;  Location: [facility] ORS;  Service: Orthopedics;  Laterality: Left;   SHOULDER SURGERY         Home Medications    Prior to Admission medications   Medication Sig Start Date End Date Taking? Authorizing Provider  hydrOXYzine (ATARAX) 25 MG tablet Take 1 tablet (25 mg total) by mouth every 6 (six) hours as needed for up to 7 days for itching. 04/15/23 04/22/23 Yes Darryl Friendly B, PA-C  triamcinolone cream (KENALOG) 0.1 % Apply 1 Application topically 2 (two) times daily. 04/15/23  Yes Darryl Friendly B, PA-C  amLODipine (NORVASC) 10 MG tablet Take 10 mg by mouth daily.    [provider]  amLODipine (NORVASC) 10 MG tablet Take 1 tablet by mouth daily. 08/13/22   [provider]  amoxicillin-clavulanate (AUGMENTIN) 875-125 MG tablet Take 1 tablet by mouth 2 (two) times daily. Patient not taking: Reported on 04/11/2023 10/07/22   Ward, Layla Maw, DO   Apoaequorin (PREVAGEN) 10 MG CAPS Take by mouth.    [provider]  aspirin EC 325 MG tablet Take 325 mg by mouth daily.    [provider]  fluticasone (FLONASE) 50 MCG/ACT nasal spray Place 1 spray into both nostrils at bedtime as needed for allergies or rhinitis.    [provider]  losartan (COZAAR) 25 MG tablet Take 25 mg by mouth daily.    [provider]  rosuvastatin (CRESTOR) 5 MG tablet Take 5 mg by mouth at bedtime.    [provider]  sildenafil (REVATIO) 20 MG tablet Take 3-5 tabs daily as needed. 06/13/21   [provider]  vitamin Roman-12 (CYANOCOBALAMIN) 1000 MCG tablet Take 1,000 mcg by mouth daily.    [provider]    Family History Family History  Problem Relation Age of Onset   Heart attack Mother     Social History Social History   Tobacco Use   Smoking status: Never   Smokeless tobacco: Never  Vaping Use   Vaping status: Never Used  Substance Use Topics   Alcohol use: No   Drug use: No     Allergies   Sulfa antibiotics   Review of Systems Review of Systems  Musculoskeletal:  Negative for arthralgias and joint swelling.  Skin:  Positive for rash.  Negative for wound.     Physical Exam Triage Vital Signs ED Triage Vitals  Encounter Vitals Group     BP 04/15/23 1053 (!) 171/77     Systolic BP Percentile --      Diastolic BP Percentile --      Pulse Rate 04/15/23 1053 76     Resp 04/15/23 1053 16     Temp 04/15/23 1053 97.8 F (36.6 C)     Temp Source 04/15/23 1053 Oral     SpO2 04/15/23 1053 96 %     Weight --      Height --      Head Circumference --      Peak Flow --      Pain Score 04/15/23 1051 0     Pain Loc --      Pain Education --      Exclude from Growth Chart --    No data found.  Updated Vital Signs BP (!) 171/77 (BP Location: Right Arm)   Pulse 76   Temp 97.8 F (36.6 C) (Oral)   Resp 16   SpO2 96%   Physical Exam Vitals and nursing note reviewed.   Constitutional:      General: He is not in acute distress.    Appearance: Normal appearance. He is well-developed. He is not ill-appearing.  HENT:     Head: Normocephalic and atraumatic.  Eyes:     General: No scleral icterus.    Conjunctiva/sclera: Conjunctivae normal.  Cardiovascular:     Rate and Rhythm: Normal rate and regular rhythm.  Pulmonary:     Effort: Pulmonary effort is normal. No respiratory distress.     Breath sounds: Normal breath sounds.  Musculoskeletal:     Cervical back: Neck supple.  Skin:    General: Skin is warm and dry.     Capillary Refill: Capillary refill takes less than 2 seconds.     Findings: Rash present.     Comments: Erythematous macular rash of bilateral forearms.  Few scattered pustules and papules of the lower legs consistent with insect bite.  Neurological:     General: No focal deficit present.     Mental Status: He is alert. Mental status is at baseline.     Motor: No weakness.     Gait: Gait normal.  Psychiatric:        Mood and Affect: Mood normal.        Behavior: Behavior normal.      UC Treatments / Results  Labs (all labs ordered are listed, but only abnormal results are displayed) Labs Reviewed - No data to display  EKG   Radiology No results found.  Procedures Procedures (including critical care time)  Medications Ordered in UC Medications - No data to display  Initial Impression / Assessment and Plan / UC Course  I have reviewed the triage vital signs and the nursing notes.  Pertinent labs & imaging results that were available during my care of the patient were reviewed by me and considered in my medical decision making (see chart for details).   89 year old male presents for rash of bilateral arms and legs and itching.  Symptoms began after he was outside pulling grass/weeds.  Patient has tried topical hydrocortisone without relief.  Has not tried antihistamines.  On exam he does have erythematous macular rash of  bilateral forearms and a few scattered pustules and papules of the legs consistent with insect bites.  Will treat at this time with topical  triamcinolone cream and hydroxyzine.  Reviewed cool compresses.  Reviewed return precautions.   Final Clinical Impressions(s) / UC Diagnoses   Final diagnoses:  Rash and nonspecific skin eruption  Pruritus     Discharge Instructions      -I sent a topical corticosteroid to the pharmacy to apply to the rash and insect bites. - I also sent a medication to help with the itching.  May start with half a tablet and if it does not make you too tired take the other half the tablet.  May take this every 6 hours as needed for itching. - Can also apply cool compresses, topical Benadryl to the areas of itching.    ED Prescriptions     Medication Sig Dispense Auth. Provider   triamcinolone cream (KENALOG) 0.1 % Apply 1 Application topically 2 (two) times daily. 45 g Darryl Friendly B, PA-C   hydrOXYzine (ATARAX) 25 MG tablet Take 1 tablet (25 mg total) by mouth every 6 (six) hours as needed for up to 7 days for itching. 28 tablet Shirlee Latch, PA-C      PDMP not reviewed this encounter.   Shirlee Latch, PA-C 04/15/23 1128

## 2023-04-15 NOTE — Discharge Instructions (Signed)
-  I sent a topical corticosteroid to the pharmacy to apply to the rash and insect bites. - I also sent a medication to help with the itching.  May start with half a tablet and if it does not make you too tired take the other half the tablet.  May take this every 6 hours as needed for itching. - Can also apply cool compresses, topical Benadryl to the areas of itching.

## 2023-05-16 ENCOUNTER — Other Ambulatory Visit: Payer: Self-pay

## 2023-05-16 ENCOUNTER — Ambulatory Visit: Payer: Medicare Other

## 2023-05-16 ENCOUNTER — Ambulatory Visit
Admission: EM | Admit: 2023-05-16 | Discharge: 2023-05-16 | Disposition: A | Discharge status: Home / Self Care | Payer: Medicare Other | Attending: Family Medicine | Admitting: Family Medicine

## 2023-05-16 DIAGNOSIS — R051 Acute cough: Secondary | ICD-10-CM

## 2023-05-16 DIAGNOSIS — R059 Cough, unspecified: Secondary | ICD-10-CM

## 2023-05-16 DIAGNOSIS — U071 COVID-19: Secondary | ICD-10-CM | POA: Diagnosis not present

## 2023-05-16 LAB — SARS CORONAVIRUS 2 BY RT PCR: SARS Coronavirus 2 by RT PCR: POSITIVE — AB

## 2023-05-16 MED ORDER — BENZONATATE 100 MG PO CAPS: 100.0000 mg | ORAL_CAPSULE | Freq: Three times a day (TID) | ORAL | 0 refills | Status: DC

## 2023-05-16 MED ORDER — NIRMATRELVIR/RITONAVIR (PAXLOVID) TABLET (RENAL DOSING): 2.0000 | ORAL_TABLET | Freq: Two times a day (BID) | ORAL | 0 refills | Status: AC

## 2023-05-16 MED ORDER — FLUTICASONE PROPIONATE 50 MCG/ACT NA SUSP: 1.0000 | Freq: Every evening | NASAL | 0 refills | Status: DC | PRN

## 2023-05-16 NOTE — Discharge Instructions (Addendum)
He tested positive for COVID.  I do not see anything on his chest x-ray and I will contact you if the radiologist sees something and we need to change our treatment plan.  I have called in Paxlovid.  He should take this twice a day for 5 days.  This decreases the risk of going to the hospital or having severe symptoms.  While he is on this medication he needs to not take amlodipine, rosuvastatin, sildenafil and should not restart them for an additional 3 days after finishing the medicine.  Use Flonase and Tessalon for cough.  Make sure he rest and drink plenty of fluid.  Obtain a pulse oximeter from the pharmacy.  Monitor oxygen saturation and if this drops below 90% go to the emergency room.  Follow-up with primary care as soon as possible.  If anything worsens and he has increasing cough, shortness of breath, chest pain, nausea/vomiting, weakness he needs to go to the ER.

## 2023-05-16 NOTE — ED Triage Notes (Addendum)
Pt started having a nonproductive cough for several days and is concerned for COVID. denies fever Pt states COVID is going around at their church

## 2023-05-16 NOTE — ED Provider Notes (Signed)
[facility]    CSN: [id number] Arrival date & time: 05/16/23  1206      History   Chief Complaint Chief Complaint  Patient presents with   Cough    HPI Darryl Roman is a 89 y.o. male.   Patient presents today with a several day history of deep cough.  He is accompanied by his wife who help provide the majority of history.  Reports associated congestion but denies any fever, chest pain, shortness of breath, nausea, vomiting.  He has been given Robitussin as well as Tylenol without improvement of symptoms.  They are concerned for COVID as there is COVID going around their church.  Has had COVID approximately a year ago.  Has had COVID vaccines but not had most recent booster.  Denies any recent antibiotics or steroids.  Denies any history of diabetes, asthma, COPD.  He does not smoke.    Past Medical History:  Diagnosis Date   History of kidney stones    Hypertension    Kidney stones    Prostate cancer (HCC)    Stroke [facility])     Patient Active Problem List   Diagnosis Date Noted   Prostate cancer (HCC) 10/03/2022    Past Surgical History:  Procedure Laterality Date   KIDNEY STONE SURGERY     MASS EXCISION Left 10/14/2019   Procedure: EXCISION SOFT TISSUE MAS OF PALM;  Surgeon: Christena Flake, MD;  Location: [facility] ORS;  Service: Orthopedics;  Laterality: Left;   SHOULDER SURGERY         Home Medications    Prior to Admission medications   Medication Sig Start Date End Date Taking? Authorizing Provider  amLODipine (NORVASC) 10 MG tablet Take 10 mg by mouth daily.   Yes [provider]  amLODipine (NORVASC) 10 MG tablet Take 1 tablet by mouth daily. 08/13/22  Yes [provider]  Apoaequorin (PREVAGEN) 10 MG CAPS Take by mouth.   Yes [provider]  aspirin EC 325 MG tablet Take 325 mg by mouth daily.   Yes [provider]  benzonatate (TESSALON) 100 MG capsule Take 1 capsule (100 mg total) by mouth every 8 (eight)  hours. 05/16/23  Yes ,  K, PA-C  losartan (COZAAR) 25 MG tablet Take 25 mg by mouth daily.   Yes [provider]  nirmatrelvir/ritonavir, renal dosing, (PAXLOVID) 10 x 150 MG & 10 x 100MG  TABS Take 2 tablets by mouth 2 (two) times daily for 5 days. Patient GFR is 59. Take nirmatrelvir (150 mg) one tablet twice daily for 5 days and ritonavir (100 mg) one tablet twice daily for 5 days. 05/16/23 05/21/23 Yes ,  K, PA-C  rosuvastatin (CRESTOR) 5 MG tablet Take 5 mg by mouth at bedtime.   Yes [provider]  vitamin B-12 (CYANOCOBALAMIN) 1000 MCG tablet Take 1,000 mcg by mouth daily.   Yes [provider]  fluticasone (FLONASE) 50 MCG/ACT nasal spray Place 1 spray into both nostrils at bedtime as needed for allergies or rhinitis. 05/16/23   , Noberto Retort, PA-C    Family History Family History  Problem Relation Age of Onset   Heart attack Mother     Social History Social History   Tobacco Use   Smoking status: Never   Smokeless tobacco: Never  Vaping Use   Vaping status: Never Used  Substance Use Topics   Alcohol use: No   Drug use: No     Allergies   Sulfa antibiotics  Review of Systems Review of Systems  Constitutional:  Positive for activity change. Negative for appetite change, fatigue and fever.  HENT:  Positive for congestion. Negative for sinus pressure, sneezing and sore throat.   Respiratory:  Positive for cough. Negative for shortness of breath.   Cardiovascular:  Negative for chest pain.  Gastrointestinal:  Negative for abdominal pain, diarrhea, nausea and vomiting.     Physical Exam Triage Vital Signs ED Triage Vitals  Encounter Vitals Group     BP 05/16/23 1246 (!) 140/75     Systolic BP Percentile --      Diastolic BP Percentile --      Pulse Rate 05/16/23 1246 70     Resp 05/16/23 1246 18     Temp 05/16/23 1246 (!) 97.4 F (36.3 C)     Temp Source 05/16/23 1246 Oral     SpO2 --      Weight --      Height --       Head Circumference --      Peak Flow --      Pain Score 05/16/23 1248 0     Pain Loc --      Pain Education --      Exclude from Growth Chart --    No data found.  Updated Vital Signs BP (!) 140/75   Pulse 70   Temp (!) 97.4 F (36.3 C) (Oral)   Resp 18   SpO2 97%   Visual Acuity Right Eye Distance:   Left Eye Distance:   Bilateral Distance:    Right Eye Near:   Left Eye Near:    Bilateral Near:     Physical Exam Vitals reviewed.  Constitutional:      General: He is awake.     Appearance: Normal appearance. He is well-developed. He is not ill-appearing.     Comments: Very pleasant male appears stated age in no acute distress sitting comfortably in exam room  HENT:     Head: Normocephalic and atraumatic.     Right Ear: Tympanic membrane, ear canal and external ear normal. Tympanic membrane is not erythematous or bulging.     Left Ear: Tympanic membrane, ear canal and external ear normal. Tympanic membrane is not erythematous or bulging.     Nose: Nose normal.     Mouth/Throat:     Pharynx: Uvula midline. No oropharyngeal exudate, posterior oropharyngeal erythema or uvula swelling.  Cardiovascular:     Rate and Rhythm: Normal rate and regular rhythm.     Heart sounds: Normal heart sounds, S1 normal and S2 normal. No murmur heard. Pulmonary:     Effort: Pulmonary effort is normal. No accessory muscle usage or respiratory distress.     Breath sounds: No stridor. Examination of the right-lower field reveals decreased breath sounds. Examination of the left-lower field reveals decreased breath sounds. Decreased breath sounds present. No wheezing, rhonchi or rales.  Neurological:     Mental Status: He is alert.  Psychiatric:        Behavior: Behavior is cooperative.      UC Treatments / Results  Labs (all labs ordered are listed, but only abnormal results are displayed) Labs Reviewed  SARS CORONAVIRUS 2 BY RT PCR - Abnormal; Notable for the following components:       Result Value   SARS Coronavirus 2 by RT PCR POSITIVE (*)    All other components within normal limits    EKG   Radiology No results found.  Procedures Procedures (including critical care time)  Medications Ordered in UC Medications - No data to display  Initial Impression / Assessment and Plan / UC Course  I have reviewed the triage vital signs and the nursing notes.  Pertinent labs & imaging results that were available during my care of the patient were reviewed by me and considered in my medical decision making (see chart for details).     Patient is well-appearing, afebrile, nontoxic, nontachycardic.  COVID testing was positive in clinic today.  Chest x-ray was obtained with primary reviewed indicating no acute cardiopulmonary disease.  At the time of discharge you were waiting for radiologist over read and we will contact patient if this is different need to change her treatment plan.  Given his age and comorbidities he is a candidate for antiviral therapy.  His last metabolic panel obtained March 2024 showed EGFR of 59 mL/min.  Renal dosing of Paxlovid was sent to pharmacy.  We discussed that while on this medication he needs to hold his amlodipine, tadalafil, rosuvastatin and not restart them for 3 days after completing course of medication.  He was prescribed Tessalon and Flonase for cough.  Recommended that they obtain a pulse oximeter and monitor his oxygen saturation at home.  If this drops below 90% he is to go to the emergency room immediately.  Recommended close follow-up with primary care.  Discussed that if he has any worsening or changing symptoms including chest pain, shortness of breath, nausea/vomiting interfering with oral intake, weakness he needs to go to the ER.  All questions were answered to patient and wife satisfaction.  Final Clinical Impressions(s) / UC Diagnoses   Final diagnoses:  COVID-19  Acute cough     Discharge Instructions      He tested  positive for COVID.  I do not see anything on his chest x-ray and I will contact you if the radiologist sees something and we need to change our treatment plan.  I have called in Paxlovid.  He should take this twice a day for 5 days.  This decreases the risk of going to the hospital or having severe symptoms.  While he is on this medication he needs to not take amlodipine, rosuvastatin, sildenafil and should not restart them for an additional 3 days after finishing the medicine.  Use Flonase and Tessalon for cough.  Make sure he rest and drink plenty of fluid.  Obtain a pulse oximeter from the pharmacy.  Monitor oxygen saturation and if this drops below 90% go to the emergency room.  Follow-up with primary care as soon as possible.  If anything worsens and he has increasing cough, shortness of breath, chest pain, nausea/vomiting, weakness he needs to go to the ER.     ED Prescriptions     Medication Sig Dispense Auth. Provider   fluticasone (FLONASE) 50 MCG/ACT nasal spray Place 1 spray into both nostrils at bedtime as needed for allergies or rhinitis. 16 g ,  K, PA-C   benzonatate (TESSALON) 100 MG capsule Take 1 capsule (100 mg total) by mouth every 8 (eight) hours. 21 capsule ,  K, PA-C   nirmatrelvir/ritonavir, renal dosing, (PAXLOVID) 10 x 150 MG & 10 x 100MG  TABS Take 2 tablets by mouth 2 (two) times daily for 5 days. Patient GFR is 59. Take nirmatrelvir (150 mg) one tablet twice daily for 5 days and ritonavir (100 mg) one tablet twice daily for 5 days. 20 tablet , Noberto Retort, PA-C  PDMP not reviewed this encounter.   Jeani Hawking, PA-C 05/16/23 1433

## 2023-05-25 ENCOUNTER — Ambulatory Visit
Admission: EM | Admit: 2023-05-25 | Discharge: 2023-05-25 | Discharge status: Against Medical Advice | Payer: Medicare Other | Source: Home / Self Care

## 2023-05-25 ENCOUNTER — Encounter: Payer: Self-pay | Admitting: Emergency Medicine

## 2023-05-25 DIAGNOSIS — R443 Hallucinations, unspecified: Secondary | ICD-10-CM

## 2023-05-25 NOTE — ED Provider Notes (Signed)
[facility]    CSN: [id number] Arrival date & time: 05/25/23  1139      History   Chief Complaint Chief Complaint  Patient presents with   Covid Positive   Hallucinations    HPI Darryl Roman is a 89 y.o. male.   HPI  89 year old male with past medical history significant for prostate cancer, stroke, hypertension, and dementia presents with his wife for evaluation of change in mental status since being diagnosed with COVID on 05/16/2023.  Patient's wife reports that he started to develop altered mental status while taking the Paxlovid, which she is finished, and also the [employer] that were prescribed for cough.  She also reports that he has been having hallucinations.  Patient was noncontributory to his history.  Past Medical History:  Diagnosis Date   History of kidney stones    Hypertension    Kidney stones    Prostate cancer (HCC)    Stroke [facility])     Patient Active Problem List   Diagnosis Date Noted   Prostate cancer (HCC) 10/03/2022    Past Surgical History:  Procedure Laterality Date   KIDNEY STONE SURGERY     MASS EXCISION Left 10/14/2019   Procedure: EXCISION SOFT TISSUE MAS OF PALM;  Surgeon: Christena Flake, MD;  Location: [facility] ORS;  Service: Orthopedics;  Laterality: Left;   SHOULDER SURGERY         Home Medications    Prior to Admission medications   Medication Sig Start Date End Date Taking? Authorizing Provider  amLODipine (NORVASC) 10 MG tablet Take 10 mg by mouth daily.    [provider]  amLODipine (NORVASC) 10 MG tablet Take 1 tablet by mouth daily. 08/13/22   [provider]  Apoaequorin (PREVAGEN) 10 MG CAPS Take by mouth.    [provider]  aspirin EC 325 MG tablet Take 325 mg by mouth daily.    [provider]  benzonatate (TESSALON) 100 MG capsule Take 1 capsule (100 mg total) by mouth every 8 (eight) hours. 05/16/23   Raspet, Noberto Retort, PA-C  fluticasone (FLONASE) 50 MCG/ACT nasal  spray Place 1 spray into both nostrils at bedtime as needed for allergies or rhinitis. 05/16/23   Raspet, Noberto Retort, PA-C  losartan (COZAAR) 25 MG tablet Take 25 mg by mouth daily.    [provider]  rosuvastatin (CRESTOR) 5 MG tablet Take 5 mg by mouth at bedtime.    [provider]  vitamin B-12 (CYANOCOBALAMIN) 1000 MCG tablet Take 1,000 mcg by mouth daily.    [provider]    Family History Family History  Problem Relation Age of Onset   Heart attack Mother     Social History Social History   Tobacco Use   Smoking status: Never   Smokeless tobacco: Never  Vaping Use   Vaping status: Never Used  Substance Use Topics   Alcohol use: No   Drug use: No     Allergies   Sulfa antibiotics   Review of Systems Review of Systems  Psychiatric/Behavioral:  Positive for agitation, confusion and hallucinations.      Physical Exam Triage Vital Signs ED Triage Vitals  Encounter Vitals Group     BP 05/25/23 1157 (!) 146/81     Systolic BP Percentile --      Diastolic BP Percentile --      Pulse Rate 05/25/23 1157 66     Resp 05/25/23 1157 15     Temp 05/25/23  1157 98.1 F (36.7 C)     Temp Source 05/25/23 1157 Oral     SpO2 05/25/23 1157 97 %     Weight 05/25/23 1155 198 lb 3.1 oz (89.9 kg)     Height 05/25/23 1155 5\' 9"  (1.753 m)     Head Circumference --      Peak Flow --      Pain Score 05/25/23 1155 3     Pain Loc --      Pain Education --      Exclude from Growth Chart --    No data found.  Updated Vital Signs BP (!) 146/81 (BP Location: Left Arm)   Pulse 66   Temp 98.1 F (36.7 C) (Oral)   Resp 15   Ht 5\' 9"  (1.753 m)   Wt 198 lb 3.1 oz (89.9 kg)   SpO2 97%   BMI 29.27 kg/m   Visual Acuity Right Eye Distance:   Left Eye Distance:   Bilateral Distance:    Right Eye Near:   Left Eye Near:    Bilateral Near:     Physical Exam Vitals and nursing note reviewed.  Constitutional:      General: He is not in acute  distress.    Appearance: Normal appearance.  Skin:    General: Skin is warm and dry.     Capillary Refill: Capillary refill takes less than 2 seconds.  Neurological:     Mental Status: He is alert.      UC Treatments / Results  Labs (all labs ordered are listed, but only abnormal results are displayed) Labs Reviewed - No data to display  EKG   Radiology No results found.  Procedures Procedures (including critical care time)  Medications Ordered in UC Medications - No data to display  Initial Impression / Assessment and Plan / UC Course  I have reviewed the triage vital signs and the nursing notes.  Pertinent labs & imaging results that were available during my care of the patient were reviewed by me and considered in my medical decision making (see chart for details).   Patient is an 89 year old male with a history of dementia and CVA presenting with his wife for evaluation of increased agitation and hallucinations since contracting COVID 9 days ago.  The wife reports that symptoms began when he started taking Paxlovid and Tessalon Perles.  He has finished that Paxlovid but is still taking the [employer].  She reports that she has noticed an increase in the frequency of agitation and hallucinations over the past several days.  I have advised the patient's wife that we cannot fully evaluate the patient here and that he needs to be evaluated in the ER as she needs imaging of his brain as well as lab work to determine the cause of the change in mental status.  I did offer to perform a urinalysis here though I am not able to perform a CT scan to rule out any intracranial organic process.  The patient's wife reports that she would prefer to wait a few days to see if things change or until he has his follow-up appointment with his PCP in several weeks.  I reiterated to the patient's wife that he needs to be evaluated in the ER as his mental status is worsening.  I will have patient  signed out AMA.   Final Clinical Impressions(s) / UC Diagnoses   Final diagnoses:  Hallucinations     Discharge Instructions  I have recommended that you go to the emergency department for evaluation of your increased agitation and hallucinations since contracting COVID.  You have decided to wait a few days to see if things change or until his follow-up appointment with his PCP.  Again, I am recommending you take Izek to the ER for evaluation of his change in mental status.     ED Prescriptions   None    PDMP not reviewed this encounter.   Becky Augusta, NP 05/25/23 1218

## 2023-05-25 NOTE — ED Triage Notes (Signed)
Patient is here with his wife that states that he was diagnosed with COVID on 05/16/23.  Patient's wife states that since starting his medicines for COVID, he has been more anxious and worrisome.  Patient's wife states that he sometimes sees things that are not there.

## 2023-05-25 NOTE — Discharge Instructions (Signed)
I have recommended that you go to the emergency department for evaluation of your increased agitation and hallucinations since contracting COVID.  You have decided to wait a few days to see if things change or until his follow-up appointment with his PCP.  Again, I am recommending you take Darryl Roman to the ER for evaluation of his change in mental status.

## 2023-07-12 ENCOUNTER — Inpatient Hospital Stay: Payer: Medicare Other | Attending: Oncology

## 2023-07-12 DIAGNOSIS — C61 Malignant neoplasm of prostate: Secondary | ICD-10-CM | POA: Diagnosis present

## 2023-07-12 LAB — CBC WITH DIFFERENTIAL/PLATELET
Abs Immature Granulocytes: 0.05 10*3/uL (ref 0.00–0.07)
Basophils Absolute: 0 10*3/uL (ref 0.0–0.1)
Basophils Relative: 1 %
Eosinophils Absolute: 0.1 10*3/uL (ref 0.0–0.5)
Eosinophils Relative: 2 %
HCT: 43.6 % (ref 39.0–52.0)
Hemoglobin: 15.6 g/dL (ref 13.0–17.0)
Immature Granulocytes: 1 %
Lymphocytes Relative: 37 %
Lymphs Abs: 2.6 10*3/uL (ref 0.7–4.0)
MCH: 32 pg (ref 26.0–34.0)
MCHC: 35.8 g/dL (ref 30.0–36.0)
MCV: 89.5 fL (ref 80.0–100.0)
Monocytes Absolute: 0.6 10*3/uL (ref 0.1–1.0)
Monocytes Relative: 8 %
Neutro Abs: 3.7 10*3/uL (ref 1.7–7.7)
Neutrophils Relative %: 51 %
Platelets: 207 10*3/uL (ref 150–400)
RBC: 4.87 MIL/uL (ref 4.22–5.81)
RDW: 12.4 % (ref 11.5–15.5)
WBC: 7 10*3/uL (ref 4.0–10.5)
nRBC: 0 % (ref 0.0–0.2)

## 2023-07-12 LAB — COMPREHENSIVE METABOLIC PANEL
ALT: 21 U/L (ref 0–44)
AST: 27 U/L (ref 15–41)
Albumin: 4.3 g/dL (ref 3.5–5.0)
Alkaline Phosphatase: 132 U/L — ABNORMAL HIGH (ref 38–126)
Anion gap: 6 (ref 5–15)
BUN: 19 mg/dL (ref 8–23)
CO2: 23 mmol/L (ref 22–32)
Calcium: 9.2 mg/dL (ref 8.9–10.3)
Chloride: 109 mmol/L (ref 98–111)
Creatinine, Ser: 1.14 mg/dL (ref 0.61–1.24)
GFR, Estimated: >60 mL/min (ref >60)
Glucose, Bld: 101 mg/dL — ABNORMAL HIGH (ref 70–99)
Potassium: 4.2 mmol/L (ref 3.5–5.1)
Sodium: 138 mmol/L (ref 135–145)
Total Bilirubin: 0.9 mg/dL (ref 0.3–1.2)
Total Protein: 7.4 g/dL (ref 6.5–8.1)

## 2023-07-12 LAB — PSA: Prostatic Specific Antigen: 6.84 ng/mL — ABNORMAL HIGH (ref 0.00–4.00)

## 2023-07-17 ENCOUNTER — Telehealth: Payer: Self-pay

## 2023-07-17 NOTE — Telephone Encounter (Signed)
Informed patient's wife(requested by patient she take call) to continue to monitor PSA counts & no further treatment needed at this time per Dr. Smith Robert. Aware of apts in Jan

## 2023-07-28 ENCOUNTER — Ambulatory Visit
Admission: EM | Admit: 2023-07-28 | Discharge: 2023-07-28 | Disposition: A | Discharge status: Home / Self Care | Payer: Medicare Other | Attending: Emergency Medicine | Admitting: Emergency Medicine

## 2023-07-28 ENCOUNTER — Encounter: Payer: Self-pay | Admitting: Emergency Medicine

## 2023-07-28 DIAGNOSIS — M1611 Unilateral primary osteoarthritis, right hip: Secondary | ICD-10-CM

## 2023-07-28 DIAGNOSIS — T7840XA Allergy, unspecified, initial encounter: Secondary | ICD-10-CM | POA: Diagnosis not present

## 2023-07-28 MED ORDER — TRIAMCINOLONE ACETONIDE 0.1 % EX CREA: 1.0000 | TOPICAL_CREAM | Freq: Two times a day (BID) | CUTANEOUS | 0 refills | Status: DC

## 2023-07-28 MED ORDER — NAPROXEN 500 MG PO TABS: 500.0000 mg | ORAL_TABLET | Freq: Two times a day (BID) | ORAL | 1 refills | Status: DC | PRN

## 2023-07-28 MED ORDER — FEXOFENADINE HCL 60 MG PO TABS: 60.0000 mg | ORAL_TABLET | Freq: Two times a day (BID) | ORAL | 1 refills | Status: DC

## 2023-07-28 NOTE — ED Provider Notes (Signed)
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 Clinical staff has updated patient's PMH/PSHx, current medication list, and drug allergies/intolerances to ensure comprehensive history available to assist in medical decision making.   History:   HPI: Darryl Roman is a 89 y.o. male who presents today with his wife  with complaints of fire ant bites.  Patient's wife is a primary historian as patient is hard of hearing.  She states while he was out working in the yard on Friday, he interacted with some friends and has been scratching ever since.  He used a steroid cream that was given when this occurred previously but he is out of that cream.  He denies any body aches, chills or fever.  No additional medications tried. Patient is also inquiring of a refill of his prednisone that was prescribed for his hip pain a week ago.  Patient's wife states this is a chronic issue and he was followed by his primary care provider and she prescribed a short course of steroids.  They are inquiring if the could repeat the steroids today.   Past Medical History:  Diagnosis Date   History of kidney stones    Hypertension    Kidney stones    Prostate cancer (HCC)    Stroke [facility])     Past Surgical History:  Procedure Laterality Date   KIDNEY STONE SURGERY     MASS EXCISION Left 10/14/2019   Procedure: EXCISION SOFT TISSUE MAS OF PALM;  Surgeon: Christena Flake, MD;  Location: [facility] ORS;  Service: Orthopedics;  Laterality: Left;   SHOULDER SURGERY      Family History  Problem Relation Age of Onset   Heart attack Mother     Social History   Tobacco Use   Smoking status: Never   Smokeless tobacco: Never  Vaping Use    Vaping status: Never Used  Substance Use Topics   Alcohol use: No   Drug use: No    Patient Active Problem List   Diagnosis Date Noted   Prostate cancer (HCC) 10/03/2022    Home Medications:    Current Meds  Medication Sig   amLODipine (NORVASC) 10 MG tablet Take 1 tablet by mouth daily.   aspirin EC 325 MG tablet Take 325 mg by mouth daily.   fexofenadine (ALLEGRA) 60 MG tablet Take 1 tablet (60 mg total) by mouth 2 (two) times daily.   losartan (COZAAR) 25 MG tablet Take 25 mg by mouth daily.   naproxen (NAPROSYN) 500 MG tablet Take 1 tablet (500 mg total) by mouth 2 (two) times daily as needed.   triamcinolone cream (KENALOG) 0.1 % Apply 1 Application topically 2 (two) times daily.    Allergies:   Sulfa antibiotics  Review of Systems (ROS): Review of Systems  Musculoskeletal:  Positive for arthralgias. Negative for joint swelling.  Skin:  Positive for rash. Negative for color change and wound.     Vital Signs: Today's Vitals   07/28/23 1253 07/28/23 1254 07/28/23 1332  BP:  (!) 154/77   Pulse:  66   Resp:  15   Temp:  97.8 F (36.6 C)   TempSrc:  Oral   SpO2:  96%  Weight: 198 lb 3.1 oz (89.9 kg)    Height: 5\' 9"  (1.753 m)    PainSc: 5   5     Physical Exam: Physical Exam Vitals and nursing note reviewed.  Constitutional:      Appearance: Normal appearance.  Cardiovascular:     Rate and Rhythm: Normal rate and regular rhythm.     Pulses: Normal pulses.     Heart sounds: Normal heart sounds.  Pulmonary:     Effort: Pulmonary effort is normal.     Breath sounds: Normal breath sounds.  Musculoskeletal:     Right hip: Normal. Normal range of motion.     Left hip: Normal. Normal range of motion.  Skin:    General: Skin is warm and dry.     Findings: Rash present. Rash is urticarial.     Comments: Urticarial rash of bilateral lower arms.  Neurological:     General: No focal deficit present.     Mental Status: He is alert and oriented to person, place,  and time.  Psychiatric:        Mood and Affect: Mood normal.        Behavior: Behavior normal.      Urgent Care Treatments / Results:   LABS: PLEASE NOTE: all labs that were ordered this encounter are listed, however only abnormal results are displayed. Labs Reviewed - No data to display  EKG: -None  RADIOLOGY: No results found.  PROCEDURES: Procedures  MEDICATIONS RECEIVED THIS VISIT: Medications - No data to display  PERTINENT CLINICAL COURSE NOTES/UPDATES:   Initial Impression / Assessment and Plan / Urgent Care Course:  Pertinent labs & imaging results that were available during my care of the patient were personally reviewed by me and considered in my medical decision making (see lab/imaging section of note for values and interpretations).  [name] is a 89 y.o. male who presents to [facility] today with complaints of fire ant bites and hip pain, diagnosed with the same , and treated as such with the medications below. NP and patient reviewed discharge instructions below during visit.   Patient is well appearing overall in clinic today. He does not appear to be in any acute distress. Presenting symptoms (see HPI) and exam as documented above.   I have reviewed the follow up and strict return precautions for any new or worsening symptoms. Patient is aware of symptoms that would be deemed urgent/emergent, and would thus require further evaluation either here or in the emergency department. At the time of discharge, he verbalized understanding and consent with the discharge plan as it was reviewed with him. All questions were fielded by provider and/or clinic staff prior to patient discharge.    Final Clinical Impressions / Urgent Care Diagnoses:   Final diagnoses:  Allergic reaction, initial encounter  Arthritis of right hip    New Prescriptions:  [place] Controlled Substance Registry consulted? Not Applicable  Meds ordered this encounter  Medications    naproxen (NAPROSYN) 500 MG tablet    Sig: Take 1 tablet (500 mg total) by mouth 2 (two) times daily as needed.    Dispense:  30 tablet    Refill:  1   fexofenadine (ALLEGRA) 60 MG tablet    Sig: Take 1 tablet (60 mg total) by mouth 2 (two) times daily.    Dispense:  30 tablet    Refill:  1   triamcinolone cream (KENALOG) 0.1 %    Sig: Apply 1 Application topically  2 (two) times daily.    Dispense:  30 g    Refill:  0      Discharge Instructions      You were seen for right hip pain and bug bites and are being treated for the same.   - Take the allergy medication twice a day.  - Use the steroid cream twice a day.  - Use a regular lotion after the shower.  - Use naproxen up to twice a day for hip pain. Take with food.   Take care, Dr. Sharlet Salina, NP-c     Recommended Follow up Care:  Patient encouraged to follow up with the following provider within the specified time frame, or sooner as dictated by the severity of his symptoms. As always, he was instructed that for any urgent/emergent care needs, he should seek care either here or in the emergency department for more immediate evaluation.   Bailey Mech, DNP, NP-c   Bailey Mech, NP 07/28/23 1444

## 2023-07-28 NOTE — ED Triage Notes (Signed)
Patient states that he was working in the yard and got bit by some fire ants yesterday.  Patient reports redness and swelling to both forearms.

## 2023-07-28 NOTE — Discharge Instructions (Addendum)
You were seen for right hip pain and bug bites and are being treated for the same.   - Take the allergy medication twice a day.  - Use the steroid cream twice a day.  - Use a regular lotion after the shower.  - Use naproxen up to twice a day for hip pain. Take with food.   Take care, Dr. Sharlet Salina, NP-c

## 2023-10-13 ENCOUNTER — Ambulatory Visit
Admission: EM | Admit: 2023-10-13 | Discharge: 2023-10-13 | Disposition: A | Discharge status: Home / Self Care | Payer: Medicare Other | Attending: Family Medicine | Admitting: Family Medicine

## 2023-10-13 ENCOUNTER — Encounter: Payer: Self-pay | Admitting: Emergency Medicine

## 2023-10-13 DIAGNOSIS — J069 Acute upper respiratory infection, unspecified: Secondary | ICD-10-CM | POA: Diagnosis not present

## 2023-10-13 LAB — RESP PANEL BY RT-PCR (RSV, FLU A&B, COVID)  RVPGX2
Influenza A by PCR: NEGATIVE
Influenza B by PCR: NEGATIVE
Resp Syncytial Virus by PCR: NEGATIVE
SARS Coronavirus 2 by RT PCR: NEGATIVE

## 2023-10-13 NOTE — Discharge Instructions (Addendum)
 Your COVID, influenza and RSV test were all negative.  You have a viral illness that will gradually get better over the next 7 to 10 days.  Note that cough can last the longest up to 3 weeks.

## 2023-10-13 NOTE — ED Triage Notes (Signed)
 Patient c/o cough and chest congestion that started yesterday.  Patient unsure of fever.

## 2023-10-13 NOTE — ED Provider Notes (Signed)
 [facility]    CSN: [id number] Arrival date & time: 10/13/23  1314      History   Chief Complaint Chief Complaint  Patient presents with   Cough    HPI Darryl Roman is a 89 y.o. male.   HPI  History obtained from his wife and the patient. Darryl Roman presents for sneezing, cough, nasal congestion, rhinorrhea that started yesterday. Taking Mucinex  and Tylenol  without relief. Wife is concerned that he has COVID. No vomiting, diarrhea, sore throat or headache. No known sick contacts. No medications for symptoms prior to arrival.      Past Medical History:  Diagnosis Date   History of kidney stones    Hypertension    Kidney stones    Prostate cancer (HCC)    Stroke [facility])     Patient Active Problem List   Diagnosis Date Noted   Prostate cancer (HCC) 10/03/2022    Past Surgical History:  Procedure Laterality Date   KIDNEY STONE SURGERY     MASS EXCISION Left 10/14/2019   Procedure: EXCISION SOFT TISSUE MAS OF PALM;  Surgeon: Edie Norleen PARAS, MD;  Location: [facility] ORS;  Service: Orthopedics;  Laterality: Left;   SHOULDER SURGERY         Home Medications    Prior to Admission medications   Medication Sig Start Date End Date Taking? Authorizing Provider  amLODipine (NORVASC) 10 MG tablet Take 1 tablet by mouth daily. 08/13/22   [provider]  Apoaequorin (PREVAGEN) 10 MG CAPS Take by mouth.    [provider]  aspirin  EC 325 MG tablet Take 325 mg by mouth daily.    [provider]  fexofenadine  (ALLEGRA ) 60 MG tablet Take 1 tablet (60 mg total) by mouth 2 (two) times daily. 07/28/23   Benjamin, Lunise, NP  fluticasone  (FLONASE ) 50 MCG/ACT nasal spray Place 1 spray into both nostrils at bedtime as needed for allergies or rhinitis. 05/16/23   Raspet, Erin K, PA-C  losartan  (COZAAR ) 25 MG tablet Take 25 mg by mouth daily.    [provider]  naproxen  (NAPROSYN ) 500 MG tablet Take 1 tablet (500 mg total) by mouth 2 (two) times  daily as needed. 07/28/23   Benjamin, Lunise, NP  rosuvastatin  (CRESTOR ) 5 MG tablet Take 5 mg by mouth at bedtime.    [provider]  triamcinolone  cream (KENALOG ) 0.1 % Apply 1 Application topically 2 (two) times daily. 07/28/23   Morene Morrow, NP  vitamin B-12 (CYANOCOBALAMIN) 1000 MCG tablet Take 1,000 mcg by mouth daily.    [provider]    Family History Family History  Problem Relation Age of Onset   Heart attack Mother     Social History Social History   Tobacco Use   Smoking status: Never   Smokeless tobacco: Never  Vaping Use   Vaping status: Never Used  Substance Use Topics   Alcohol use: No   Drug use: No     Allergies   Sulfa antibiotics   Review of Systems Review of Systems: negative unless otherwise stated in HPI.      Physical Exam Triage Vital Signs ED Triage Vitals  Encounter Vitals Group     BP 10/13/23 1403 (!) 157/76     Systolic BP Percentile --      Diastolic BP Percentile --      Pulse Rate 10/13/23 1403 63     Resp 10/13/23 1403 15     Temp 10/13/23 1403 97.6 F (36.4 C)  Temp Source 10/13/23 1403 Oral     SpO2 10/13/23 1403 96 %     Weight 10/13/23 1402 198 lb 3.1 oz (89.9 kg)     Height 10/13/23 1402 5' 9 (1.753 m)     Head Circumference --      Peak Flow --      Pain Score 10/13/23 1402 0     Pain Loc --      Pain Education --      Exclude from Growth Chart --    No data found.  Updated Vital Signs BP (!) 157/76 (BP Location: Left Arm)   Pulse 63   Temp 97.6 F (36.4 C) (Oral)   Resp 15   Ht 5' 9 (1.753 m)   Wt 89.9 kg   SpO2 96%   BMI 29.27 kg/m   Visual Acuity Right Eye Distance:   Left Eye Distance:   Bilateral Distance:    Right Eye Near:   Left Eye Near:    Bilateral Near:     Physical Exam GEN:     alert, non-toxic appearing elderly male in no distress    HENT:  mucus membranes moist, oropharyngeal without lesions or erythema, no tonsillar hypertrophy or exudates, clear  nasal discharge EYES:   no scleral injection or discharge RESP:  no increased work of breathing, clear to auscultation bilaterally CVS:   regular rate and rhythm Skin:   warm and dry    UC Treatments / Results  Labs (all labs ordered are listed, but only abnormal results are displayed) Labs Reviewed  RESP PANEL BY RT-PCR (RSV, FLU A&B, COVID)  [id number]    EKG   Radiology No results found.  Procedures Procedures (including critical care time)  Medications Ordered in UC Medications - No data to display  Initial Impression / Assessment and Plan / UC Course  I have reviewed the triage vital signs and the nursing notes.  Pertinent labs & imaging results that were available during my care of the patient were reviewed by me and considered in my medical decision making (see chart for details).       Pt is a 89 y.o. male who presents for 1 day of respiratory symptoms. Darryl Roman is afebrile here without recent antipyretics. Satting well on room air. Overall pt is non-toxic appearing, well hydrated, without respiratory distress. Pulmonary exam is unremarkable.  COVID, influenza and RSV panel obtained and was negative.  History most consistent with viral respiratory illness as his wife has similar symptoms.  Discussed symptomatic treatment.  Explained lack of efficacy of antibiotics in viral disease.  Typical duration of symptoms discussed.   Return and ED precautions given and voiced understanding. Discussed MDM, treatment plan and plan for follow-up with patient and his wife who agree with plan.     Final Clinical Impressions(s) / UC Diagnoses   Final diagnoses:  Viral URI with cough     Discharge Instructions      Your COVID, influenza and RSV test were all negative.  You have a viral illness that will gradually get better over the next 7 to 10 days.  Note that cough can last the longest up to 3 weeks.      ED Prescriptions   None    PDMP not reviewed this encounter.    Kriste Berth, DO 10/13/23 1512

## 2023-10-14 ENCOUNTER — Other Ambulatory Visit: Payer: Medicare Other

## 2023-10-14 ENCOUNTER — Ambulatory Visit: Payer: Medicare Other | Admitting: Oncology

## 2023-10-16 ENCOUNTER — Inpatient Hospital Stay: Payer: Medicare Other

## 2023-10-16 ENCOUNTER — Inpatient Hospital Stay: Payer: Medicare Other | Admitting: Oncology

## 2023-11-06 ENCOUNTER — Inpatient Hospital Stay (HOSPITAL_BASED_OUTPATIENT_CLINIC_OR_DEPARTMENT_OTHER): Payer: Medicare Other | Admitting: Oncology

## 2023-11-06 ENCOUNTER — Inpatient Hospital Stay: Payer: Medicare Other | Attending: Oncology

## 2023-11-06 ENCOUNTER — Encounter: Payer: Self-pay | Admitting: Oncology

## 2023-11-06 VITALS — BP 156/75 | HR 66 | Temp 95.5°F | Resp 18 | Wt 200.0 lb

## 2023-11-06 DIAGNOSIS — R531 Weakness: Secondary | ICD-10-CM | POA: Diagnosis not present

## 2023-11-06 DIAGNOSIS — Z87442 Personal history of urinary calculi: Secondary | ICD-10-CM | POA: Insufficient documentation

## 2023-11-06 DIAGNOSIS — Z79899 Other long term (current) drug therapy: Secondary | ICD-10-CM | POA: Diagnosis not present

## 2023-11-06 DIAGNOSIS — C61 Malignant neoplasm of prostate: Secondary | ICD-10-CM

## 2023-11-06 DIAGNOSIS — R5383 Other fatigue: Secondary | ICD-10-CM | POA: Insufficient documentation

## 2023-11-06 DIAGNOSIS — I10 Essential (primary) hypertension: Secondary | ICD-10-CM | POA: Insufficient documentation

## 2023-11-06 DIAGNOSIS — Z7982 Long term (current) use of aspirin: Secondary | ICD-10-CM | POA: Insufficient documentation

## 2023-11-06 DIAGNOSIS — Z8673 Personal history of transient ischemic attack (TIA), and cerebral infarction without residual deficits: Secondary | ICD-10-CM | POA: Diagnosis not present

## 2023-11-06 LAB — CBC WITH DIFFERENTIAL/PLATELET
Abs Immature Granulocytes: 0.07 10*3/uL (ref 0.00–0.07)
Basophils Absolute: 0.1 10*3/uL (ref 0.0–0.1)
Basophils Relative: 1 %
Eosinophils Absolute: 0.2 10*3/uL (ref 0.0–0.5)
Eosinophils Relative: 3 %
HCT: 45.5 % (ref 39.0–52.0)
Hemoglobin: 16.3 g/dL (ref 13.0–17.0)
Immature Granulocytes: 1 %
Lymphocytes Relative: 32 %
Lymphs Abs: 2.7 10*3/uL (ref 0.7–4.0)
MCH: 31.9 pg (ref 26.0–34.0)
MCHC: 35.8 g/dL (ref 30.0–36.0)
MCV: 89 fL (ref 80.0–100.0)
Monocytes Absolute: 0.9 10*3/uL (ref 0.1–1.0)
Monocytes Relative: 11 %
Neutro Abs: 4.4 10*3/uL (ref 1.7–7.7)
Neutrophils Relative %: 52 %
Platelets: 210 10*3/uL (ref 150–400)
RBC: 5.11 MIL/uL (ref 4.22–5.81)
RDW: 11.9 % (ref 11.5–15.5)
WBC: 8.5 10*3/uL (ref 4.0–10.5)
nRBC: 0 % (ref 0.0–0.2)

## 2023-11-06 LAB — COMPREHENSIVE METABOLIC PANEL
ALT: 26 U/L (ref 0–44)
AST: 29 U/L (ref 15–41)
Albumin: 4.2 g/dL (ref 3.5–5.0)
Alkaline Phosphatase: 129 U/L — ABNORMAL HIGH (ref 38–126)
Anion gap: 11 (ref 5–15)
BUN: 20 mg/dL (ref 8–23)
CO2: 23 mmol/L (ref 22–32)
Calcium: 9.1 mg/dL (ref 8.9–10.3)
Chloride: 107 mmol/L (ref 98–111)
Creatinine, Ser: 1.08 mg/dL (ref 0.61–1.24)
GFR, Estimated: >60 mL/min (ref >60)
Glucose, Bld: 91 mg/dL (ref 70–99)
Potassium: 3.9 mmol/L (ref 3.5–5.1)
Sodium: 141 mmol/L (ref 135–145)
Total Bilirubin: 1.2 mg/dL (ref 0.0–1.2)
Total Protein: 7.2 g/dL (ref 6.5–8.1)

## 2023-11-06 NOTE — Progress Notes (Signed)
 Hematology/Oncology Consult note [facility]  Telephone:([phone] Fax:([phone]) [phone]  Patient Care Team: Rudolpho Norleen BIRCH, MD as PCP - General (Internal Medicine)   Name of the patient: Darryl Roman  [id number]  [birth year]   Date of visit: 11/06/23  Diagnosis- non metastatic prostate cancer currently under observation   Chief complaint/ Reason for visit-routine follow-up of prostate cancer  Heme/Onc history: Patient is a 89 year old male who was diagnosed with prostate adenocarcinoma T1c Gleason's grade 3+30 in 2009 and was treated with HIFU.  He had a PSA recurrence in 2017 when his PSA went up to 4.5.  He had a repeat biopsy at that time which showed a Gleason's grade 3+3 adenocarcinoma involving 1 out of 12 core biopsies.  He was then been on active surveillance since then and PSA has been around 4.7.  He is not presently on any treatment for his prostate cancer.     PSA 4.7 in December 2021, PSA on 03/22/2021 was 4.5.  4.7 in December 2022 6.5 in June 2023 4.2 in December 2023    Interval history- He is doing overall well for his age and he has not had any recent hospitalizations or falls.  Appetite and weight have remained stable.  States that he has had some mild back pain for a while now which has remained unchanged  ECOG PS- 1 Pain scale- 0   Review of systems- Review of Systems  Constitutional:  Positive for malaise/fatigue. Negative for chills, fever and weight loss.  HENT:  Negative for congestion, ear discharge and nosebleeds.   Eyes:  Negative for blurred vision.  Respiratory:  Negative for cough, hemoptysis, sputum production, shortness of breath and wheezing.   Cardiovascular:  Negative for chest pain, palpitations, orthopnea and claudication.  Gastrointestinal:  Negative for abdominal pain, blood in stool, constipation, diarrhea, heartburn, melena, nausea and vomiting.  Genitourinary:  Negative for dysuria, flank pain, frequency,  hematuria and urgency.  Musculoskeletal:  Negative for back pain, joint pain and myalgias.  Skin:  Negative for rash.  Neurological:  Negative for dizziness, tingling, focal weakness, seizures, weakness and headaches.  Endo/Heme/Allergies:  Does not bruise/bleed easily.  Psychiatric/Behavioral:  Negative for depression and suicidal ideas. The patient does not have insomnia.       Allergies  Allergen Reactions   Sulfa Antibiotics Other (See Comments)     Past Medical History:  Diagnosis Date   History of kidney stones    Hypertension    Kidney stones    Prostate cancer (HCC)    Stroke [facility])      Past Surgical History:  Procedure Laterality Date   KIDNEY STONE SURGERY     MASS EXCISION Left 10/14/2019   Procedure: EXCISION SOFT TISSUE MAS OF PALM;  Surgeon: Edie Norleen PARAS, MD;  Location: [facility] ORS;  Service: Orthopedics;  Laterality: Left;   SHOULDER SURGERY      Social History   Socioeconomic History   Marital status: Married    Spouse name: Not on file   Number of children: Not on file   Years of education: Not on file   Highest education level: Not on file  Occupational History   Not on file  Tobacco Use   Smoking status: Never   Smokeless tobacco: Never  Vaping Use   Vaping status: Never Used  Substance and Sexual Activity   Alcohol use: No   Drug use: No   Sexual activity: Yes  Other Topics Concern   Not on  file  Social History Narrative   Not on file   Social Drivers of Health   Financial Resource Strain: Not on file  Food Insecurity: Not on file  Transportation Needs: Not on file  Physical Activity: Not on file  Stress: Not on file  Social Connections: Not on file  Intimate Partner Violence: Not on file    Family History  Problem Relation Age of Onset   Heart attack Mother      Current Outpatient Medications:    amLODipine (NORVASC) 10 MG tablet, Take 1 tablet by mouth daily., Disp: , Rfl:    Apoaequorin (PREVAGEN) 10 MG CAPS, Take by  mouth., Disp: , Rfl:    aspirin  EC 325 MG tablet, Take 325 mg by mouth daily., Disp: , Rfl:    fexofenadine  (ALLEGRA ) 60 MG tablet, Take 1 tablet (60 mg total) by mouth 2 (two) times daily., Disp: 30 tablet, Rfl: 1   fluticasone  (FLONASE ) 50 MCG/ACT nasal spray, Place 1 spray into both nostrils at bedtime as needed for allergies or rhinitis., Disp: 16 g, Rfl: 0   losartan  (COZAAR ) 25 MG tablet, Take 25 mg by mouth daily., Disp: , Rfl:    naproxen  (NAPROSYN ) 500 MG tablet, Take 1 tablet (500 mg total) by mouth 2 (two) times daily as needed., Disp: 30 tablet, Rfl: 1   rosuvastatin  (CRESTOR ) 5 MG tablet, Take 5 mg by mouth at bedtime., Disp: , Rfl:    triamcinolone  cream (KENALOG ) 0.1 %, Apply 1 Application topically 2 (two) times daily., Disp: 30 g, Rfl: 0   vitamin B-12 (CYANOCOBALAMIN) 1000 MCG tablet, Take 1,000 mcg by mouth daily., Disp: , Rfl:   Physical exam:  Vitals:   11/06/23 1125  BP: (!) 156/75  Pulse: 66  Resp: 18  Temp: (!) 95.5 F (35.3 C)  TempSrc: Tympanic  SpO2: 99%  Weight: 200 lb (90.7 kg)   Physical Exam Cardiovascular:     Rate and Rhythm: Normal rate and regular rhythm.     Heart sounds: Normal heart sounds.  Pulmonary:     Effort: Pulmonary effort is normal.     Breath sounds: Normal breath sounds.  Abdominal:     General: Bowel sounds are normal.     Palpations: Abdomen is soft.  Skin:    General: Skin is warm and dry.  Neurological:     Mental Status: He is alert and oriented to person, place, and time.         Latest Ref Rng & Units 11/06/2023   10:48 AM  CMP  Glucose 70 - 99 mg/dL 91   BUN 8 - 23 mg/dL 20   Creatinine 9.38 - 1.24 mg/dL 8.91   Sodium 864 - 854 mmol/L 141   Potassium 3.5 - 5.1 mmol/L 3.9   Chloride 98 - 111 mmol/L 107   CO2 22 - 32 mmol/L 23   Calcium  8.9 - 10.3 mg/dL 9.1   Total Protein 6.5 - 8.1 g/dL 7.2   Total Bilirubin 0.0 - 1.2 mg/dL 1.2   Alkaline Phos 38 - 126 U/L 129   AST 15 - 41 U/L 29   ALT 0 - 44 U/L 26        Latest Ref Rng & Units 11/06/2023   10:48 AM  CBC  WBC 4.0 - 10.5 K/uL 8.5   Hemoglobin 13.0 - 17.0 g/dL 83.6   Hematocrit 60.9 - 52.0 % 45.5   Platelets 150 - 400 K/uL 210     No images are attached  to the encounter.  No results found.   Assessment and plan- Patient is a 89 y.o. male with history of nonmetastatic prostate cancer currently under active surveillance here for routine follow-up  Patient's PSA has been  around 4 for last 2 years but his PSA 3 months ago was mildly elevated at 6.8 as compared to 4.4 prior.  PSA levels from today are pending.  If overall PSA shows an upward trend my plan is to get a PSMA PET scan to see if there is any evidence of distant disease.  As of now he is being monitored without any ADT.  If there is a continued rise in his PSA with or without metastatic disease I will have a discussion about initiating ADT at that time.  CBC with differential CMP PSA in 3 months in 6 months and I will see him back in 6 months   Visit Diagnosis 1. Prostate cancer [facility])      Dr. Annah Skene, MD, MPH [facility] [phone] 11/06/2023 1:15 PM

## 2023-11-07 LAB — PSA: Prostatic Specific Antigen: 5.9 ng/mL — ABNORMAL HIGH (ref 0.00–4.00)

## 2023-11-08 ENCOUNTER — Telehealth: Payer: Self-pay

## 2023-11-08 NOTE — Telephone Encounter (Signed)
-----   Message from Annah JAYSON Skene sent at 11/07/2023  8:30 AM EST ----- Please let patient know- psa is going up and down from 4.4 to 6.8 and now back down to 5.9. we will continue to check his psa every 3 months as planned. I am not getting any ct scans at this time. Thanks, acr

## 2023-11-08 NOTE — Telephone Encounter (Signed)
 Patient's wife notified and will make patient aware.

## 2024-02-03 ENCOUNTER — Other Ambulatory Visit: Payer: Medicare Other

## 2024-02-11 ENCOUNTER — Inpatient Hospital Stay: Payer: Medicare Other | Attending: Oncology

## 2024-02-11 DIAGNOSIS — C61 Malignant neoplasm of prostate: Secondary | ICD-10-CM | POA: Diagnosis present

## 2024-02-11 LAB — CBC WITH DIFFERENTIAL (CANCER CENTER ONLY)
Abs Immature Granulocytes: 0.07 10*3/uL (ref 0.00–0.07)
Basophils Absolute: 0.1 10*3/uL (ref 0.0–0.1)
Basophils Relative: 1 %
Eosinophils Absolute: 0.2 10*3/uL (ref 0.0–0.5)
Eosinophils Relative: 2 %
HCT: 44.3 % (ref 39.0–52.0)
Hemoglobin: 15.7 g/dL (ref 13.0–17.0)
Immature Granulocytes: 1 %
Lymphocytes Relative: 30 %
Lymphs Abs: 2.4 10*3/uL (ref 0.7–4.0)
MCH: 32.1 pg (ref 26.0–34.0)
MCHC: 35.4 g/dL (ref 30.0–36.0)
MCV: 90.6 fL (ref 80.0–100.0)
Monocytes Absolute: 0.7 10*3/uL (ref 0.1–1.0)
Monocytes Relative: 9 %
Neutro Abs: 4.4 10*3/uL (ref 1.7–7.7)
Neutrophils Relative %: 57 %
Platelet Count: 203 10*3/uL (ref 150–400)
RBC: 4.89 MIL/uL (ref 4.22–5.81)
RDW: 12 % (ref 11.5–15.5)
WBC Count: 7.8 10*3/uL (ref 4.0–10.5)
nRBC: 0 % (ref 0.0–0.2)

## 2024-02-11 LAB — CMP (CANCER CENTER ONLY)
ALT: 19 U/L (ref 0–44)
AST: 24 U/L (ref 15–41)
Albumin: 4.1 g/dL (ref 3.5–5.0)
Alkaline Phosphatase: 134 U/L — ABNORMAL HIGH (ref 38–126)
Anion gap: 8 (ref 5–15)
BUN: 21 mg/dL (ref 8–23)
CO2: 24 mmol/L (ref 22–32)
Calcium: 9 mg/dL (ref 8.9–10.3)
Chloride: 110 mmol/L (ref 98–111)
Creatinine: 1.2 mg/dL (ref 0.61–1.24)
GFR, Estimated: 58 mL/min — ABNORMAL LOW (ref >60)
Glucose, Bld: 102 mg/dL — ABNORMAL HIGH (ref 70–99)
Potassium: 4.7 mmol/L (ref 3.5–5.1)
Sodium: 142 mmol/L (ref 135–145)
Total Bilirubin: 0.9 mg/dL (ref 0.0–1.2)
Total Protein: 6.9 g/dL (ref 6.5–8.1)

## 2024-02-11 LAB — PSA: Prostatic Specific Antigen: 6.34 ng/mL — ABNORMAL HIGH (ref 0.00–4.00)

## 2024-02-13 ENCOUNTER — Ambulatory Visit: Payer: Self-pay | Admitting: Oncology

## 2024-02-13 NOTE — Telephone Encounter (Signed)
 Per Dr. Randy Buttery "Please let him know that his psa is 6.3 overall stable as compared to October 2024. Over the last 1 year- psa going up slowly but we still do not need to initiate any  treatment at this time. Thanks, Archana".  Outbound call to patient; informed both caregiver / spouse and patient of above.  Caregiver also indicated that post void dribbling has not returned and that the patient is doing well at this time.  Advised to contact us  if he experiences any new / worsening symptoms.

## 2024-02-13 NOTE — Telephone Encounter (Signed)
-----   Message from Avonne Boettcher sent at 02/13/2024  8:38 AM EDT ----- Please let him know that his psa is 6.3 overall stable as compared to October 2024. Over the last 1 year- psa going up slowly but we still do not need to initiate any  treatment at this time. Thanks, Lebron Prows

## 2024-02-16 ENCOUNTER — Other Ambulatory Visit: Payer: Self-pay

## 2024-02-16 ENCOUNTER — Emergency Department
Admission: EM | Admit: 2024-02-16 | Discharge: 2024-02-16 | Disposition: A | Discharge status: Home / Self Care | Attending: Emergency Medicine | Admitting: Emergency Medicine

## 2024-02-16 ENCOUNTER — Ambulatory Visit
Admission: EM | Admit: 2024-02-16 | Discharge: 2024-02-16 | Disposition: A | Discharge status: Home / Self Care | Attending: Family Medicine | Admitting: Family Medicine

## 2024-02-16 ENCOUNTER — Encounter: Payer: Self-pay | Admitting: Intensive Care

## 2024-02-16 ENCOUNTER — Emergency Department

## 2024-02-16 DIAGNOSIS — R410 Disorientation, unspecified: Secondary | ICD-10-CM | POA: Diagnosis present

## 2024-02-16 DIAGNOSIS — R4182 Altered mental status, unspecified: Secondary | ICD-10-CM | POA: Diagnosis not present

## 2024-02-16 DIAGNOSIS — R051 Acute cough: Secondary | ICD-10-CM

## 2024-02-16 LAB — URINALYSIS, ROUTINE W REFLEX MICROSCOPIC
Bilirubin Urine: NEGATIVE
Glucose, UA: NEGATIVE mg/dL
Hgb urine dipstick: NEGATIVE
Ketones, ur: NEGATIVE mg/dL
Leukocytes,Ua: NEGATIVE
Nitrite: NEGATIVE
Protein, ur: NEGATIVE mg/dL
Specific Gravity, Urine: 1.019 (ref 1.005–1.030)
pH: 6 (ref 5.0–8.0)

## 2024-02-16 LAB — COMPREHENSIVE METABOLIC PANEL WITH GFR
ALT: 18 U/L (ref 0–44)
AST: 24 U/L (ref 15–41)
Albumin: 4.1 g/dL (ref 3.5–5.0)
Alkaline Phosphatase: 127 U/L — ABNORMAL HIGH (ref 38–126)
Anion gap: 9 (ref 5–15)
BUN: 22 mg/dL (ref 8–23)
CO2: 22 mmol/L (ref 22–32)
Calcium: 9.1 mg/dL (ref 8.9–10.3)
Chloride: 110 mmol/L (ref 98–111)
Creatinine, Ser: 1.26 mg/dL — ABNORMAL HIGH (ref 0.61–1.24)
GFR, Estimated: 55 mL/min — ABNORMAL LOW (ref >60)
Glucose, Bld: 121 mg/dL — ABNORMAL HIGH (ref 70–99)
Potassium: 4.1 mmol/L (ref 3.5–5.1)
Sodium: 141 mmol/L (ref 135–145)
Total Bilirubin: 1.1 mg/dL (ref 0.0–1.2)
Total Protein: 7.1 g/dL (ref 6.5–8.1)

## 2024-02-16 LAB — CBC
HCT: 45.4 % (ref 39.0–52.0)
Hemoglobin: 15.8 g/dL (ref 13.0–17.0)
MCH: 31.5 pg (ref 26.0–34.0)
MCHC: 34.8 g/dL (ref 30.0–36.0)
MCV: 90.6 fL (ref 80.0–100.0)
Platelets: 210 10*3/uL (ref 150–400)
RBC: 5.01 MIL/uL (ref 4.22–5.81)
RDW: 12 % (ref 11.5–15.5)
WBC: 7.4 10*3/uL (ref 4.0–10.5)
nRBC: 0 % (ref 0.0–0.2)

## 2024-02-16 NOTE — Discharge Instructions (Addendum)
You have been advised to follow up immediately in the emergency department for concerning signs or symptoms as discussed during your visit. If you declined EMS transport, please have a family member take you directly to the ED at this time. Do not delay.

## 2024-02-16 NOTE — ED Provider Notes (Addendum)
 [facility]    CSN: [id number] Arrival date & time: 02/16/24  1456      History   Chief Complaint Chief Complaint  Patient presents with   Cough   Cerebrovascular Accident    HPI Darryl Roman is a 89 y.o. male.   HPI  History obtained from the patient and his wife  Darryl Roman presents for concern for confusion and 2 weeks of cough that is not improving.  Wife has been giving him Mucinex without relief.  He notes that he has been "off" recently.  He is not responding to her like he normally does.  He is not recalling information like he normally does.  Wife is worried that he is having a stroke.  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  He started acting differently yesterday.  He has been more forgetful.  He was seen by Dr. Bernetta Brilliant, neurologist but does not have a head scan for months.       Past Medical History:  Diagnosis Date   History of kidney stones    Hypertension    Kidney stones    Prostate cancer (HCC)    Stroke [name])     Patient Active Problem List   Diagnosis Date Noted   Prostate cancer (HCC) 10/03/2022    Past Surgical History:  Procedure Laterality Date   KIDNEY STONE SURGERY     MASS EXCISION Left 10/14/2019   Procedure: EXCISION SOFT TISSUE MAS OF PALM;  Surgeon: Elner Hahn, MD;  Location: [facility] ORS;  Service: Orthopedics;  Laterality: Left;   SHOULDER SURGERY         Home Medications    Prior to Admission medications   Medication Sig Start Date End Date Taking? Authorizing Provider  amLODipine (NORVASC) 10 MG tablet Take 1 tablet by mouth daily. 08/13/22   [provider]  Apoaequorin (PREVAGEN) 10 MG CAPS Take by mouth.    [provider]  aspirin EC 325 MG tablet Take 325 mg by mouth daily.    [provider]  fexofenadine  (ALLEGRA ) 60 MG tablet Take 1 tablet (60 mg total) by mouth 2 (two) times daily. 07/28/23   Benjamin, Lunise, NP  fluticasone  (FLONASE ) 50 MCG/ACT nasal spray Place  1 spray into both nostrils at bedtime as needed for allergies or rhinitis. 05/16/23   Raspet, Erin K, PA-C  losartan (COZAAR) 25 MG tablet Take 25 mg by mouth daily.    [provider]  naproxen  (NAPROSYN ) 500 MG tablet Take 1 tablet (500 mg total) by mouth 2 (two) times daily as needed. 07/28/23   Benjamin, Lunise, NP  rosuvastatin (CRESTOR) 5 MG tablet Take 5 mg by mouth at bedtime.    [provider]  triamcinolone  cream (KENALOG ) 0.1 % Apply 1 Application topically 2 (two) times daily. 07/28/23   Providence Brow, NP  vitamin B-12 (CYANOCOBALAMIN) 1000 MCG tablet Take 1,000 mcg by mouth daily.    [provider]    Family History Family History  Problem Relation Age of Onset   Heart attack Mother     Social History Social History   Tobacco Use   Smoking status: Never   Smokeless tobacco: Never  Vaping Use   Vaping status: Never Used  Substance Use Topics   Alcohol use: No   Drug use: No     Allergies   Sulfa antibiotics   Review of Systems Review of Systems: negative unless otherwise stated in HPI.  Physical Exam Triage Vital Signs ED Triage Vitals  Encounter Vitals Group     BP      Systolic BP Percentile      Diastolic BP Percentile      Pulse      Resp      Temp      Temp src      SpO2      Weight      Height      Head Circumference      Peak Flow      Pain Score      Pain Loc      Pain Education      Exclude from Growth Chart    No data found.  Updated Vital Signs BP 137/73 (BP Location: Right Arm)   Pulse 69   Temp 97.6 F (36.4 C) (Oral)   Resp 18   SpO2 93%   Visual Acuity Right Eye Distance:   Left Eye Distance:   Bilateral Distance:    Right Eye Near:   Left Eye Near:    Bilateral Near:     Physical Exam GEN:     alert, elderly male in no distress    HENT:  mucus membranes moist, no nasal discharge EYES:   pupils equal and reactive, no scleral injection or discharge RESP:  no increased work of  breathing, occasional rhonchi that clear with cough CVS:   regular rate and rhythm NEURO:  alert, recalls wife and number of children and grand-kids, unable to recall address, slow but fluid speech, CN 2-12 grossly intact, no appreciable facial droop,  sensation grossly intact, strength 5/5 bilateral UE and LE, normal coordination,  Skin:   warm and dry    UC Treatments / Results  Labs (all labs ordered are listed, but only abnormal results are displayed) Labs Reviewed - No data to display  EKG   Radiology No results found.  Procedures Procedures (including critical care time)  Medications Ordered in UC Medications - No data to display  Initial Impression / Assessment and Plan / UC Course  I have reviewed the triage vital signs and the nursing notes.  Pertinent labs & imaging results that were available during my care of the patient were reviewed by me and considered in my medical decision making (see chart for details).      Pt is a 89 y.o. male who presents for 2 weeks of cough and new onset confusion since yesterday. Darryl Roman is afebrile here without recent antipyretics. Satting 93% on room air.    He was seen by [facility] neurology, Dr. Mason Sole, for visual hallucinations and thought to have Lewy body dementia.  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  It was thought that the vision loss was contributing to the hallucinations but likely not the primary cause.  Patient was told to increase donezepil to 10 mg daily.  He is interacting differently than when I saw him in January.  Discussed likely need for head imaging and chest imaging with wife and she will call her son to drive them to [facility] ED for further evaluation.   Called [facility] ED and spoke with triage RN regarding patient's arrival via private vehicle and concern for possible acute versus subacute stroke but he is outside the window for tPA.  Symptoms started yesterday.  Considered  metabolic encephalopathy versus toxic encephalopathy 2/2 pneumonia as well.  Discussed MDM, treatment plan and plan for follow-up with  patient and his wife who agree with plan.     Final Clinical Impressions(s) / UC Diagnoses   Final diagnoses:  Altered mental status, unspecified altered mental status type  Acute cough     Discharge Instructions      You have been advised to follow up immediately in the emergency department for concerning signs or symptoms as discussed during your visit. If you declined EMS transport, please have a family member take you directly to the ED at this time. Do not delay.    ED Prescriptions   None    PDMP not reviewed this encounter.     Aldair Rickel, DO 02/16/24 1552

## 2024-02-16 NOTE — ED Triage Notes (Signed)
 Cough for a few weeks Has been taking otc cough medication and no help Spits up with cough  No fever   Wife has concerns about having a stroke. He had one about 12 years ago Wife states that he has been acting strange. Not communicating with her and doesn't not response to her

## 2024-02-16 NOTE — ED Provider Notes (Signed)
 [facility] Provider Note    Event Date/Time   First MD Initiated Contact with Patient 02/16/24 1937     (approximate)   History   Altered Mental Status   HPI  Darryl Roman is a 89 y.o. male who presents to the emergency department today accompanied by family.  History is primarily obtained from family.  Family states that starting yesterday patient was having a harder time understanding them.  They did feel he was confused.  Has continued today.  The patient has not exhibited any other signs of stroke although family states he had one a number of years ago.  Family also says that about a week ago he was having problems with some dribbling of his urine.  Patient has not had any fevers or chills.     Physical Exam   Triage Vital Signs: ED Triage Vitals  Encounter Vitals Group     BP 02/16/24 1703 (!) 152/84     Systolic BP Percentile --      Diastolic BP Percentile --      Pulse Rate 02/16/24 1703 74     Resp 02/16/24 1703 16     Temp 02/16/24 1703 97.7 F (36.5 C)     Temp Source 02/16/24 1703 Oral     SpO2 02/16/24 1703 95 %     Weight 02/16/24 1704 200 lb (90.7 kg)     Height 02/16/24 1704 5\' 9"  (1.753 m)     Head Circumference --      Peak Flow --      Pain Score 02/16/24 1704 0     Pain Loc --      Pain Education --      Exclude from Growth Chart --     Most recent vital signs: Vitals:   02/16/24 1703  BP: (!) 152/84  Pulse: 74  Resp: 16  Temp: 97.7 F (36.5 C)  SpO2: 95%   General: Awake, alert.  CV:  Good peripheral perfusion. Regular rate and rhythm. Resp:  Normal effort. Lungs clear. Abd:  No distention.  Other:  Face symmetric. EOMI. PERRL. Able to lift all extremities against gravity. Sensation grossly intact.    ED Results / Procedures / Treatments   Labs (all labs ordered are listed, but only abnormal results are displayed) Labs Reviewed  COMPREHENSIVE METABOLIC PANEL WITH GFR - Abnormal; Notable for the  following components:      Result Value   Glucose, Bld 121 (*)    Creatinine, Ser 1.26 (*)    Alkaline Phosphatase 127 (*)    GFR, Estimated 55 (*)    All other components within normal limits  URINALYSIS, ROUTINE W REFLEX MICROSCOPIC - Abnormal; Notable for the following components:   Color, Urine YELLOW (*)    APPearance CLOUDY (*)    All other components within normal limits  CBC     EKG  I, Marylynn Soho, attending physician, personally viewed and interpreted this EKG  EKG Time: 1711 Rate: 70 Rhythm: normal sinus rhythm Axis: left axis deviation Intervals: qtc 466 QRS: RBBB, LAFB ST changes: no st elevation Impression: abnormal ekg  RADIOLOGY I independently interpreted and visualized the CT head. My interpretation: No ICH Radiology interpretation:  IMPRESSION:  Atrophy, chronic microvascular disease.    No acute intracranial abnormality.    Old right occipital infarct.    Right temporal arachnoid cyst, stable.      PROCEDURES:  Critical Care performed: No    MEDICATIONS ORDERED  IN ED: Medications - No data to display   IMPRESSION / MDM / ASSESSMENT AND PLAN / ED COURSE  I reviewed the triage vital signs and the nursing notes.                              Differential diagnosis includes, but is not limited to, CVA, infection, anemia, dementia  Patient's presentation is most consistent with acute presentation with potential threat to life or bodily function.  Patient presented to the emergency department today brought in by family because of concerns for increased confusion.  On exam patient is not completely oriented.  Does not however have any focal neurodeficits.  CT head ordered from triage without concerning abnormality.  Blood work and urine without findings concerning for infection.  I did discuss this with family.  Did discuss possibility of CVA although I do think it is unlikely.  Offered MRI.  At this time however family would like to wait  to obtain that as an outpatient.  We did discuss potential risk if this was a stroke that by delaying diagnosis you could risk further strokes.  Did encourage them to contact primary care tomorrow to try to arrange for outpatient MRI.      FINAL CLINICAL IMPRESSION(S) / ED DIAGNOSES   Final diagnoses:  Confusion        Rx / DC Orders    Note:  This document was prepared using Dragon voice recognition software and may include unintentional dictation errors.    Marylynn Soho, MD 02/16/24 2322

## 2024-02-16 NOTE — ED Triage Notes (Signed)
 Patient started having AMS yesterday. Denies trouble urinating.   C/o cough for a few weeks and otc robitussin and mucinex meds with no relief. Denies fevers.   History of stroke years ago with vision deficits.   Wife reports patients baseline is memory loss. Reports he has hallucinations intermittently X1 year

## 2024-02-16 NOTE — ED Notes (Signed)
 Patient is being discharged from the Urgent Care and sent to the [facility] Emergency Department via private vehicle with wife . Per Dr. Percilla Boys, patient is in need of higher level of care due to ongoing cough and altered mental status. Patient and wife is aware and verbalizes understanding of plan of care.  Vitals:   02/16/24 1507  BP: 137/73  Pulse: 69  Resp: 18  Temp: 97.6 F (36.4 C)  SpO2: 93%

## 2024-02-26 ENCOUNTER — Other Ambulatory Visit: Payer: Self-pay | Admitting: Neurology

## 2024-02-26 DIAGNOSIS — R441 Visual hallucinations: Secondary | ICD-10-CM

## 2024-02-26 DIAGNOSIS — F02818 Dementia in other diseases classified elsewhere, unspecified severity, with other behavioral disturbance: Secondary | ICD-10-CM

## 2024-02-27 ENCOUNTER — Ambulatory Visit
Admission: RE | Admit: 2024-02-27 | Discharge: 2024-02-27 | Disposition: A | Discharge status: Home / Self Care | Source: Ambulatory Visit | Attending: Neurology | Admitting: Neurology

## 2024-02-27 DIAGNOSIS — R441 Visual hallucinations: Secondary | ICD-10-CM | POA: Diagnosis present

## 2024-02-27 DIAGNOSIS — G3183 Dementia with Lewy bodies: Secondary | ICD-10-CM | POA: Insufficient documentation

## 2024-02-27 DIAGNOSIS — F02818 Dementia in other diseases classified elsewhere, unspecified severity, with other behavioral disturbance: Secondary | ICD-10-CM | POA: Diagnosis present

## 2024-04-28 ENCOUNTER — Ambulatory Visit
Admission: EM | Admit: 2024-04-28 | Discharge: 2024-04-28 | Disposition: A | Discharge status: Home / Self Care | Attending: Physician Assistant | Admitting: Physician Assistant

## 2024-04-28 ENCOUNTER — Encounter: Payer: Self-pay | Admitting: Emergency Medicine

## 2024-04-28 ENCOUNTER — Ambulatory Visit (INDEPENDENT_AMBULATORY_CARE_PROVIDER_SITE_OTHER)

## 2024-04-28 DIAGNOSIS — R052 Subacute cough: Secondary | ICD-10-CM

## 2024-04-28 DIAGNOSIS — M62838 Other muscle spasm: Secondary | ICD-10-CM | POA: Diagnosis not present

## 2024-04-28 DIAGNOSIS — M545 Low back pain, unspecified: Secondary | ICD-10-CM | POA: Diagnosis not present

## 2024-04-28 LAB — URINALYSIS, W/ REFLEX TO CULTURE (INFECTION SUSPECTED)
Bilirubin Urine: NEGATIVE
Glucose, UA: NEGATIVE mg/dL
Hgb urine dipstick: NEGATIVE
Ketones, ur: NEGATIVE mg/dL
Nitrite: NEGATIVE
Protein, ur: NEGATIVE mg/dL
Specific Gravity, Urine: 1.02 (ref 1.005–1.030)
Squamous Epithelial / HPF: NONE SEEN /HPF (ref 0–5)
pH: 7 (ref 5.0–8.0)

## 2024-04-28 MED ORDER — BACLOFEN 5 MG PO TABS: 5.0000 mg | ORAL_TABLET | Freq: Two times a day (BID) | ORAL | 0 refills | Status: DC | PRN

## 2024-04-28 MED ORDER — LIDOCAINE 5 % EX PTCH: 1.0000 | MEDICATED_PATCH | CUTANEOUS | 0 refills | Status: DC

## 2024-04-28 MED ORDER — CELECOXIB 100 MG PO CAPS: 100.0000 mg | ORAL_CAPSULE | Freq: Two times a day (BID) | ORAL | 0 refills | Status: DC | PRN

## 2024-04-28 NOTE — ED Triage Notes (Signed)
 Pt presents c/o sharp back pain x 3 days. Pt denies falls and/or injury. Pt says even riding in the car to urgent care today caused him pain when hitting little bumps in the road. Pt reports pain onset randomly.

## 2024-04-28 NOTE — Discharge Instructions (Addendum)
-  Urine not really consistent with UTI. Will call if culture positive and send antibiotics -Chest xray is normal. Continue robitussin. F/u with PCP. -I sent a muscle relaxer called Baclofen , anti-inflammatory medicine called celecoxib  and lidocaine  patches for back pain -If not improving in a few days make PCP follow up -Go to ER for acute worsening of symptoms.

## 2024-04-28 NOTE — ED Provider Notes (Signed)
 [facility]    CSN: [id number] Arrival date & time: 04/28/24  1302      History   Chief Complaint Chief Complaint  Patient presents with   Back Pain    HPI Darryl Roman is a 89 y.o. male with history of Lewy body dementia, hypertension, prostate cancer, arthritis, GERD, stroke, kidney stones, and gout. Patient's wife is present today.   Patient and wife report sharp intermittent right lower back pain without radiation x 2 to 3 days.  No fall or injury.  No pain in the leg, numbness, tingling or weakness.  Patient reports when he gets up he feels like there is a catch.  Patient's wife says he is having a hard time standing all the way up due to the pain.  Does not have any pain if he sitting completely still.  Patient's wife would like to have his urine checked.  Patient is denying dysuria, difficulty urinating, frequency or urgency.   Wife also reporting cough x 1 month which is worse at night. No fever or shortness of breath.  Patient has taken Tylenol  and it helped a little.  HPI  Past Medical History:  Diagnosis Date   History of kidney stones    Hypertension    Kidney stones    Prostate cancer (HCC)    Stroke [facility])     Patient Active Problem List   Diagnosis Date Noted   Prostate cancer (HCC) 10/03/2022    Past Surgical History:  Procedure Laterality Date   KIDNEY STONE SURGERY     MASS EXCISION Left 10/14/2019   Procedure: EXCISION SOFT TISSUE MAS OF PALM;  Surgeon: Edie Norleen PARAS, MD;  Location: [facility] ORS;  Service: Orthopedics;  Laterality: Left;   SHOULDER SURGERY         Home Medications    Prior to Admission medications   Medication Sig Start Date End Date Taking? Authorizing Provider  baclofen  5 MG TABS Take 1 tablet (5 mg total) by mouth 2 (two) times daily as needed. 04/28/24  Yes Arvis Jolan NOVAK, PA-C  celecoxib  (CELEBREX ) 100 MG capsule Take 1 capsule (100 mg total) by mouth 2 (two) times daily as needed for moderate pain (pain  score 4-6). 04/28/24  Yes Arvis Jolan B, PA-C  lidocaine  (LIDODERM ) 5 % Place 1 patch onto the skin daily. Remove & Discard patch within 12 hours or as directed by MD 04/28/24  Yes Arvis Jolan B, PA-C  amLODipine (NORVASC) 10 MG tablet Take 1 tablet by mouth daily. 08/13/22   [provider]  Apoaequorin (PREVAGEN) 10 MG CAPS Take by mouth.    [provider]  aspirin EC 325 MG tablet Take 325 mg by mouth daily.    [provider]  fexofenadine  (ALLEGRA ) 60 MG tablet Take 1 tablet (60 mg total) by mouth 2 (two) times daily. 07/28/23   Benjamin, Lunise, NP  fluticasone  (FLONASE ) 50 MCG/ACT nasal spray Place 1 spray into both nostrils at bedtime as needed for allergies or rhinitis. 05/16/23   Raspet, Erin K, PA-C  losartan (COZAAR) 25 MG tablet Take 25 mg by mouth daily.    [provider]  rosuvastatin (CRESTOR) 5 MG tablet Take 5 mg by mouth at bedtime.    [provider]  triamcinolone  cream (KENALOG ) 0.1 % Apply 1 Application topically 2 (two) times daily. 07/28/23   Morene Morrow, NP  vitamin B-12 (CYANOCOBALAMIN) 1000 MCG tablet Take 1,000 mcg by mouth daily.    [provider]  Family History Family History  Problem Relation Age of Onset   Heart attack Mother     Social History Social History   Tobacco Use   Smoking status: Never    Passive exposure: Never   Smokeless tobacco: Never  Vaping Use   Vaping status: Never Used  Substance Use Topics   Alcohol use: No   Drug use: No     Allergies   Sulfa antibiotics   Review of Systems Review of Systems  Constitutional:  Negative for fatigue and fever.  Respiratory:  Positive for cough. Negative for shortness of breath.   Cardiovascular:  Negative for chest pain.  Gastrointestinal:  Negative for abdominal pain.  Genitourinary:  Negative for difficulty urinating, dysuria, frequency, hematuria and urgency.  Musculoskeletal:  Positive for back pain. Negative for  arthralgias, gait problem and joint swelling.  Skin:  Negative for rash.  Neurological:  Negative for weakness and numbness.     Physical Exam Triage Vital Signs ED Triage Vitals  Encounter Vitals Group     BP 04/28/24 1352 (!) 157/76     Girls Systolic BP Percentile --      Girls Diastolic BP Percentile --      Boys Systolic BP Percentile --      Boys Diastolic BP Percentile --      Pulse Rate 04/28/24 1352 67     Resp 04/28/24 1352 18     Temp 04/28/24 1352 97.7 F (36.5 C)     Temp Source 04/28/24 1352 Oral     SpO2 04/28/24 1352 95 %     Weight 04/28/24 1351 199 lb 15.3 oz (90.7 kg)     Height --      Head Circumference --      Peak Flow --      Pain Score 04/28/24 1350 0     Pain Loc --      Pain Education --      Exclude from Growth Chart --    No data found.  Updated Vital Signs BP (!) 157/76 (BP Location: Right Arm)   Pulse 67   Temp 97.7 F (36.5 C) (Oral)   Resp 18   Wt 199 lb 15.3 oz (90.7 kg)   SpO2 95%   BMI 29.53 kg/m       Physical Exam Vitals and nursing note reviewed.  Constitutional:      General: He is not in acute distress.    Appearance: Normal appearance. He is well-developed. He is not ill-appearing.  HENT:     Head: Normocephalic and atraumatic.  Eyes:     General: No scleral icterus.    Conjunctiva/sclera: Conjunctivae normal.  Cardiovascular:     Rate and Rhythm: Normal rate and regular rhythm.  Pulmonary:     Effort: Pulmonary effort is normal. No respiratory distress.     Breath sounds: Normal breath sounds.  Abdominal:     Palpations: Abdomen is soft.     Tenderness: There is no abdominal tenderness. There is no right CVA tenderness or left CVA tenderness.  Musculoskeletal:     Cervical back: Neck supple.     Comments: BACK No tenderness of back. Reduced ROM. Patient grabs right lower back in pain when he gets up and when he bends forward. Normal gait.  Skin:    General: Skin is warm and dry.     Capillary Refill:  Capillary refill takes less than 2 seconds.  Neurological:     General: No focal deficit present.  Mental Status: He is alert. Mental status is at baseline.     Motor: No weakness.     Gait: Gait normal.  Psychiatric:        Mood and Affect: Mood normal.        Behavior: Behavior normal.      UC Treatments / Results  Labs (all labs ordered are listed, but only abnormal results are displayed) Labs Reviewed  URINALYSIS, W/ REFLEX TO CULTURE (INFECTION SUSPECTED) - Abnormal; Notable for the following components:      Result Value   Leukocytes,Ua TRACE (*)    Bacteria, UA RARE (*)    All other components within normal limits  URINE CULTURE    EKG   Radiology DG Chest 2 View Result Date: 04/28/2024 CLINICAL DATA:  Cough. EXAM: CHEST - 2 VIEW COMPARISON:  Chest radiograph dated 05/16/2023. FINDINGS: The heart size and mediastinal contours are within normal limits. Both lungs are clear. The visualized skeletal structures are unremarkable. IMPRESSION: No active cardiopulmonary disease. Electronically Signed   By: Vanetta Chou M.D.   On: 04/28/2024 14:40    Procedures Procedures (including critical care time)  Medications Ordered in UC Medications - No data to display  Initial Impression / Assessment and Plan / UC Course  I have reviewed the triage vital signs and the nursing notes.  Pertinent labs & imaging results that were available during my care of the patient were reviewed by me and considered in my medical decision making (see chart for details).   89 y/o male with history of lewy body dementia, hypertension, prostate cancer, arthritis, GERD, stroke, kidney stones, and gout presents for right lower back pain x 2-3 days. Also, cough x 1 month. Patient's wife is present. Back pain is mostly only with movement and feels like a sharp pain and a catch ing sensation. No fever, chest pain or shortness of breath related to cough.  UA obtained today at patient's wife's  request.   Ordered chest xray for 1 month history of cough.  UA with trace leuks. Will culture and treat for UTI if +culture.  Chest xray negative.  Patient and wife decline ketorolac injection in clinic  Subacute cough: Take Robitussin and increase rest and fluids. If fever, chest pain or SOB return or ER for evaluation. If not improving in another week, PCP follow up.  Right lower back pain, muscle spasm: Will treat with Baclofen  5 mg BID PRN, celebrex  100 mg BID PRN,  lidocaine  patches. All med dosing based on calculated creatinine clearance of 52 mL/min. PCP follow up if no improving in a week. ER if worsening pain.   Final Clinical Impressions(s) / UC Diagnoses   Final diagnoses:  Subacute cough  Acute right-sided low back pain without sciatica  Muscle spasm     Discharge Instructions      -Urine not really consistent with UTI. Will call if culture positive and send antibiotics -Chest xray is normal. Continue robitussin. F/u with PCP. -I sent a muscle relaxer called Baclofen , anti-inflammatory medicine called celecoxib  and lidocaine  patches for back pain -If not improving in a few days make PCP follow up -Go to ER for acute worsening of symptoms.     ED Prescriptions     Medication Sig Dispense Auth. Provider   baclofen  5 MG TABS Take 1 tablet (5 mg total) by mouth 2 (two) times daily as needed. 15 tablet Arvis Huxley B, PA-C   celecoxib  (CELEBREX ) 100 MG capsule Take 1 capsule (100 mg total)  by mouth 2 (two) times daily as needed for moderate pain (pain score 4-6). 15 capsule Arvis Huxley B, PA-C   lidocaine  (LIDODERM ) 5 % Place 1 patch onto the skin daily. Remove & Discard patch within 12 hours or as directed by MD 10 patch Arvis Huxley NOVAK, PA-C      PDMP not reviewed this encounter.   Arvis Huxley NOVAK, PA-C 04/28/24 1517

## 2024-05-05 ENCOUNTER — Inpatient Hospital Stay: Payer: Medicare Other | Admitting: Oncology

## 2024-05-05 ENCOUNTER — Inpatient Hospital Stay: Payer: Medicare Other

## 2024-06-09 ENCOUNTER — Other Ambulatory Visit: Payer: Self-pay

## 2024-06-09 DIAGNOSIS — C61 Malignant neoplasm of prostate: Secondary | ICD-10-CM

## 2024-06-10 ENCOUNTER — Inpatient Hospital Stay (HOSPITAL_BASED_OUTPATIENT_CLINIC_OR_DEPARTMENT_OTHER): Admitting: Oncology

## 2024-06-10 ENCOUNTER — Other Ambulatory Visit: Payer: Self-pay

## 2024-06-10 ENCOUNTER — Inpatient Hospital Stay: Attending: Oncology

## 2024-06-10 ENCOUNTER — Encounter: Payer: Self-pay | Admitting: Oncology

## 2024-06-10 VITALS — BP 125/67 | HR 77 | Temp 96.6°F | Resp 19 | Ht 69.0 in | Wt 201.4 lb

## 2024-06-10 DIAGNOSIS — C61 Malignant neoplasm of prostate: Secondary | ICD-10-CM | POA: Insufficient documentation

## 2024-06-10 DIAGNOSIS — R32 Unspecified urinary incontinence: Secondary | ICD-10-CM | POA: Insufficient documentation

## 2024-06-10 LAB — CMP (CANCER CENTER ONLY)
ALT: 24 U/L (ref 0–44)
AST: 24 U/L (ref 15–41)
Albumin: 3.6 g/dL (ref 3.5–5.0)
Alkaline Phosphatase: 110 U/L (ref 38–126)
Anion gap: 9 (ref 5–15)
BUN: 18 mg/dL (ref 8–23)
CO2: 22 mmol/L (ref 22–32)
Calcium: 8.8 mg/dL — ABNORMAL LOW (ref 8.9–10.3)
Chloride: 108 mmol/L (ref 98–111)
Creatinine: 1.05 mg/dL (ref 0.61–1.24)
GFR, Estimated: >60 mL/min (ref >60)
Glucose, Bld: 148 mg/dL — ABNORMAL HIGH (ref 70–99)
Potassium: 3.8 mmol/L (ref 3.5–5.1)
Sodium: 139 mmol/L (ref 135–145)
Total Bilirubin: 0.9 mg/dL (ref 0.0–1.2)
Total Protein: 6.6 g/dL (ref 6.5–8.1)

## 2024-06-10 LAB — CBC WITH DIFFERENTIAL (CANCER CENTER ONLY)
Abs Immature Granulocytes: 0.1 K/uL — ABNORMAL HIGH (ref 0.00–0.07)
Basophils Absolute: 0.1 K/uL (ref 0.0–0.1)
Basophils Relative: 1 %
Eosinophils Absolute: 0.2 K/uL (ref 0.0–0.5)
Eosinophils Relative: 2 %
HCT: 42.9 % (ref 39.0–52.0)
Hemoglobin: 15.3 g/dL (ref 13.0–17.0)
Immature Granulocytes: 1 %
Lymphocytes Relative: 23 %
Lymphs Abs: 2 K/uL (ref 0.7–4.0)
MCH: 32.6 pg (ref 26.0–34.0)
MCHC: 35.7 g/dL (ref 30.0–36.0)
MCV: 91.3 fL (ref 80.0–100.0)
Monocytes Absolute: 0.8 K/uL (ref 0.1–1.0)
Monocytes Relative: 9 %
Neutro Abs: 5.5 K/uL (ref 1.7–7.7)
Neutrophils Relative %: 64 %
Platelet Count: 204 K/uL (ref 150–400)
RBC: 4.7 MIL/uL (ref 4.22–5.81)
RDW: 12.6 % (ref 11.5–15.5)
WBC Count: 8.6 K/uL (ref 4.0–10.5)
nRBC: 0 % (ref 0.0–0.2)

## 2024-06-10 LAB — PSA: Prostatic Specific Antigen: 8.29 ng/mL — ABNORMAL HIGH (ref 0.00–4.00)

## 2024-06-10 NOTE — Progress Notes (Signed)
 Hematology/Oncology Consult note [facility]  [phone] [phone]  Patient Care Team: Rudolpho Norleen BIRCH, MD as PCP - General (Internal Medicine)   Name of the patient: Darryl Roman  [id number]  October 27, 1932   Date of visit: 06/10/24  Diagnosis-nonmetastatic prostate cancer currently under observation  Chief complaint/ Reason for visit- Routine follow-up of prostate cancer  Heme/Onc history: Patient is a 89 year old male who was diagnosed with prostate adenocarcinoma T1c Gleason's grade 3+30 in 2009 and was treated with HIFU.  He had a PSA recurrence in 2017 when his PSA went up to 4.5.  He had a repeat biopsy at that time which showed a Gleason's grade 3+3 adenocarcinoma involving 1 out of 12 core biopsies.  He was then been on active surveillance since then and PSA has been around 4.7.  He is not presently on any treatment for his prostate cancer.     PSA 4.7 in December 2021, PSA on 03/22/2021 was 4.5.  4.7 in December 2022 6.5 in June 2023 4.2 in December 2023  Interval history-patient reports having urinary incontinence at times.  He used to see Dr. Gala from urology before but presently does not see any urologist.  ECOG PS- 2 Pain scale- 0   Review of systems- Review of Systems  Constitutional:  Negative for chills, fever, malaise/fatigue and weight loss.  HENT:  Negative for congestion, ear discharge and nosebleeds.   Eyes:  Negative for blurred vision.  Respiratory:  Negative for cough, hemoptysis, sputum production, shortness of breath and wheezing.   Cardiovascular:  Negative for chest pain, palpitations, orthopnea and claudication.  Gastrointestinal:  Negative for abdominal pain, blood in stool, constipation, diarrhea, heartburn, melena, nausea and vomiting.  Genitourinary:  Negative for dysuria, flank pain, frequency, hematuria and urgency.       Urinary incontinence  Musculoskeletal:  Negative for back pain, joint pain and  myalgias.  Skin:  Negative for rash.  Neurological:  Negative for dizziness, tingling, focal weakness, seizures, weakness and headaches.  Endo/Heme/Allergies:  Does not bruise/bleed easily.  Psychiatric/Behavioral:  Negative for depression and suicidal ideas. The patient does not have insomnia.       Allergies  Allergen Reactions   Sulfa Antibiotics Other (See Comments)     Past Medical History:  Diagnosis Date   History of kidney stones    Hypertension    Kidney stones    Prostate cancer (HCC)    Stroke [facility])      Past Surgical History:  Procedure Laterality Date   KIDNEY STONE SURGERY     MASS EXCISION Left 10/14/2019   Procedure: EXCISION SOFT TISSUE MAS OF PALM;  Surgeon: Edie Norleen PARAS, MD;  Location: [facility] ORS;  Service: Orthopedics;  Laterality: Left;   SHOULDER SURGERY      Social History   Socioeconomic History   Marital status: Married    Spouse name: Not on file   Number of children: Not on file   Years of education: Not on file   Highest education level: Not on file  Occupational History   Not on file  Tobacco Use   Smoking status: Never    Passive exposure: Never   Smokeless tobacco: Never  Vaping Use   Vaping status: Never Used  Substance and Sexual Activity   Alcohol use: No   Drug use: No   Sexual activity: Yes  Other Topics Concern   Not on file  Social History Narrative   Not on file  Social Drivers of Corporate investment banker Strain: Low Risk  (02/05/2024)   Received from [facility]   Overall Financial Resource Strain (CARDIA)    Difficulty of Paying Living Expenses: Not hard at all  Food Insecurity: Not on file  Transportation Needs: No Transportation Needs (02/05/2024)   Received from [facility] - Transportation    In the past 12 months, has lack of transportation kept you from medical appointments or from getting medications?: No    Lack of Transportation (Non-Medical): No  Physical  Activity: Not on file  Stress: Not on file  Social Connections: Not on file  Intimate Partner Violence: Not on file    Family History  Problem Relation Age of Onset   Heart attack Mother      Current Outpatient Medications:    amLODipine (NORVASC) 10 MG tablet, Take 1 tablet by mouth daily., Disp: , Rfl:    aspirin EC 325 MG tablet, Take 325 mg by mouth daily., Disp: , Rfl:    fluticasone  (FLONASE ) 50 MCG/ACT nasal spray, Place 1 spray into both nostrils at bedtime as needed for allergies or rhinitis., Disp: 16 g, Rfl: 0   losartan (COZAAR) 25 MG tablet, Take 25 mg by mouth daily., Disp: , Rfl:    memantine (NAMENDA) 5 MG tablet, Take 5 mg by mouth., Disp: , Rfl:    Multiple Vitamin (MULTI-VITAMIN) tablet, Take 1 tablet by mouth daily., Disp: , Rfl:    rosuvastatin (CRESTOR) 5 MG tablet, Take 5 mg by mouth at bedtime., Disp: , Rfl:    vitamin B-12 (CYANOCOBALAMIN) 1000 MCG tablet, Take 1,000 mcg by mouth daily., Disp: , Rfl:    Apoaequorin (PREVAGEN) 10 MG CAPS, Take by mouth. (Patient not taking: Reported on 06/10/2024), Disp: , Rfl:    baclofen  5 MG TABS, Take 1 tablet (5 mg total) by mouth 2 (two) times daily as needed. (Patient not taking: Reported on 06/10/2024), Disp: 15 tablet, Rfl: 0   celecoxib  (CELEBREX ) 100 MG capsule, Take 1 capsule (100 mg total) by mouth 2 (two) times daily as needed for moderate pain (pain score 4-6). (Patient not taking: Reported on 06/10/2024), Disp: 15 capsule, Rfl: 0   donepezil (ARICEPT) 5 MG tablet, Take by mouth., Disp: , Rfl:    fexofenadine  (ALLEGRA ) 60 MG tablet, Take 1 tablet (60 mg total) by mouth 2 (two) times daily. (Patient not taking: Reported on 06/10/2024), Disp: 30 tablet, Rfl: 1   lidocaine  (LIDODERM ) 5 %, Place 1 patch onto the skin daily. Remove & Discard patch within 12 hours or as directed by MD (Patient not taking: Reported on 06/10/2024), Disp: 10 patch, Rfl: 0   predniSONE (DELTASONE) 20 MG tablet, 2qam for 4 days, 1qam for 4days  (Patient not taking: Reported on 06/10/2024), Disp: , Rfl:    triamcinolone  cream (KENALOG ) 0.1 %, Apply 1 Application topically 2 (two) times daily. (Patient not taking: Reported on 06/10/2024), Disp: 30 g, Rfl: 0  Physical exam:  Vitals:   06/10/24 1435  BP: 125/67  Pulse: 77  Resp: 19  Temp: (!) 96.6 F (35.9 C)  TempSrc: Tympanic  SpO2: 98%  Weight: 201 lb 6.4 oz (91.4 kg)  Height: 5' 9 (1.753 m)   Physical Exam   I have personally reviewed labs listed below:    Latest Ref Rng & Units 06/10/2024    2:06 PM  CMP  Glucose 70 - 99 mg/dL 851   BUN 8 - 23 mg/dL  18   Creatinine 0.61 - 1.24 mg/dL 8.94   Sodium 864 - 854 mmol/L 139   Potassium 3.5 - 5.1 mmol/L 3.8   Chloride 98 - 111 mmol/L 108   CO2 22 - 32 mmol/L 22   Calcium 8.9 - 10.3 mg/dL 8.8   Total Protein 6.5 - 8.1 g/dL 6.6   Total Bilirubin 0.0 - 1.2 mg/dL 0.9   Alkaline Phos 38 - 126 U/L 110   AST 15 - 41 U/L 24   ALT 0 - 44 U/L 24       Latest Ref Rng & Units 06/10/2024    2:06 PM  CBC  WBC 4.0 - 10.5 K/uL 8.6   Hemoglobin 13.0 - 17.0 g/dL 84.6   Hematocrit 60.9 - 52.0 % 42.9   Platelets 150 - 400 K/uL 204     Assessment and plan- Patient is a 89 y.o. male with history of nonmetastatic prostate cancer currently under observation here for routine follow-up  PSA from today is pending.  Last PSA from May 2025 was 6.3 and it has been in this range around 6 since October 2024.  Prior to that in April 2024 his PSA was 4.23.  Depending on his PSA levels now and the velocity of increase I will decide if patient needs PSMA PET scan or needs to start ADT at this time.  I will continue to monitor his labs every 3 months and see him back in 6 months.  I am referring him to urology for concerns of urinary incontinence   Visit Diagnosis 1. Prostate cancer [facility])      Dr. Annah Skene, MD, MPH [facility] [phone] 06/10/2024 3:56 PM

## 2024-06-10 NOTE — Progress Notes (Signed)
 Patient states after urination, he continues to leak and would like insight regarding why. This has been going on for the last 6 months. He is also having some lower back pain that started about a year or so ago.

## 2024-06-12 ENCOUNTER — Other Ambulatory Visit: Payer: Self-pay

## 2024-06-12 ENCOUNTER — Encounter: Payer: Self-pay | Admitting: Intensive Care

## 2024-06-12 ENCOUNTER — Emergency Department

## 2024-06-12 ENCOUNTER — Inpatient Hospital Stay
Admission: EM | Admit: 2024-06-12 | Discharge: 2024-06-18 | DRG: 308 | Disposition: A | Discharge status: Skilled Nursing Facility | Attending: Internal Medicine | Admitting: Internal Medicine

## 2024-06-12 DIAGNOSIS — Z882 Allergy status to sulfonamides status: Secondary | ICD-10-CM

## 2024-06-12 DIAGNOSIS — Z7982 Long term (current) use of aspirin: Secondary | ICD-10-CM

## 2024-06-12 DIAGNOSIS — J440 Chronic obstructive pulmonary disease with acute lower respiratory infection: Secondary | ICD-10-CM | POA: Diagnosis not present

## 2024-06-12 DIAGNOSIS — I4891 Unspecified atrial fibrillation: Secondary | ICD-10-CM | POA: Diagnosis not present

## 2024-06-12 DIAGNOSIS — M1712 Unilateral primary osteoarthritis, left knee: Secondary | ICD-10-CM | POA: Diagnosis present

## 2024-06-12 DIAGNOSIS — I10 Essential (primary) hypertension: Secondary | ICD-10-CM | POA: Diagnosis present

## 2024-06-12 DIAGNOSIS — A419 Sepsis, unspecified organism: Secondary | ICD-10-CM | POA: Diagnosis not present

## 2024-06-12 DIAGNOSIS — F03918 Unspecified dementia, unspecified severity, with other behavioral disturbance: Secondary | ICD-10-CM | POA: Diagnosis present

## 2024-06-12 DIAGNOSIS — F0390 Unspecified dementia without behavioral disturbance: Secondary | ICD-10-CM | POA: Diagnosis present

## 2024-06-12 DIAGNOSIS — Z8673 Personal history of transient ischemic attack (TIA), and cerebral infarction without residual deficits: Secondary | ICD-10-CM

## 2024-06-12 DIAGNOSIS — J159 Unspecified bacterial pneumonia: Secondary | ICD-10-CM | POA: Diagnosis not present

## 2024-06-12 DIAGNOSIS — E785 Hyperlipidemia, unspecified: Secondary | ICD-10-CM | POA: Diagnosis present

## 2024-06-12 DIAGNOSIS — Z8249 Family history of ischemic heart disease and other diseases of the circulatory system: Secondary | ICD-10-CM

## 2024-06-12 DIAGNOSIS — C61 Malignant neoplasm of prostate: Secondary | ICD-10-CM

## 2024-06-12 DIAGNOSIS — Z7901 Long term (current) use of anticoagulants: Secondary | ICD-10-CM

## 2024-06-12 DIAGNOSIS — Z79899 Other long term (current) drug therapy: Secondary | ICD-10-CM

## 2024-06-12 LAB — BASIC METABOLIC PANEL WITH GFR
Anion gap: 8 (ref 5–15)
BUN: 20 mg/dL (ref 8–23)
CO2: 23 mmol/L (ref 22–32)
Calcium: 8.9 mg/dL (ref 8.9–10.3)
Chloride: 111 mmol/L (ref 98–111)
Creatinine, Ser: 1.37 mg/dL — ABNORMAL HIGH (ref 0.61–1.24)
GFR, Estimated: 49 mL/min — ABNORMAL LOW (ref >60)
Glucose, Bld: 97 mg/dL (ref 70–99)
Potassium: 4 mmol/L (ref 3.5–5.1)
Sodium: 142 mmol/L (ref 135–145)

## 2024-06-12 LAB — CBC
HCT: 44.1 % (ref 39.0–52.0)
Hemoglobin: 15.5 g/dL (ref 13.0–17.0)
MCH: 32.9 pg (ref 26.0–34.0)
MCHC: 35.1 g/dL (ref 30.0–36.0)
MCV: 93.6 fL (ref 80.0–100.0)
Platelets: 239 K/uL (ref 150–400)
RBC: 4.71 MIL/uL (ref 4.22–5.81)
RDW: 12.8 % (ref 11.5–15.5)
WBC: 9.4 K/uL (ref 4.0–10.5)
nRBC: 0 % (ref 0.0–0.2)

## 2024-06-12 LAB — TROPONIN I (HIGH SENSITIVITY)
Troponin I (High Sensitivity): 42 ng/L — ABNORMAL HIGH (ref <18)
Troponin I (High Sensitivity): 40 ng/L — ABNORMAL HIGH (ref <18)

## 2024-06-12 MED ORDER — APIXABAN 5 MG PO TABS
5.0000 mg | ORAL_TABLET | Freq: Two times a day (BID) | ORAL | Status: DC
  Administered 2024-06-12 – 2024-06-18 (×11): 5 mg via ORAL
  Filled 2024-06-12 (×13): qty 1

## 2024-06-12 MED ORDER — ACETAMINOPHEN 325 MG PO TABS
650.0000 mg | ORAL_TABLET | Freq: Four times a day (QID) | ORAL | Status: DC | PRN
  Administered 2024-06-14 – 2024-06-15 (×2): 650 mg via ORAL
  Filled 2024-06-12 (×2): qty 2

## 2024-06-12 MED ORDER — DILTIAZEM HCL 25 MG/5ML IV SOLN
10.0000 mg | Freq: Once | INTRAVENOUS | Status: AC
  Administered 2024-06-12: 10 mg via INTRAVENOUS
  Filled 2024-06-12: qty 5

## 2024-06-12 MED ORDER — DILTIAZEM HCL-DEXTROSE 125-5 MG/125ML-% IV SOLN (PREMIX)
5.0000 mg/h | INTRAVENOUS | Status: DC
  Administered 2024-06-12: 5 mg/h via INTRAVENOUS
  Filled 2024-06-12: qty 125

## 2024-06-12 MED ORDER — SODIUM CHLORIDE 0.9 % IV BOLUS
1000.0000 mL | Freq: Once | INTRAVENOUS | Status: AC
  Administered 2024-06-12: 1000 mL via INTRAVENOUS

## 2024-06-12 MED ORDER — POLYETHYLENE GLYCOL 3350 17 G PO PACK: 17.0000 g | PACK | Freq: Every day | ORAL | Status: DC | PRN

## 2024-06-12 MED ORDER — ACETAMINOPHEN 650 MG RE SUPP
650.0000 mg | Freq: Four times a day (QID) | RECTAL | Status: DC | PRN
  Administered 2024-06-14: 650 mg via RECTAL
  Filled 2024-06-12: qty 1

## 2024-06-12 NOTE — Assessment & Plan Note (Signed)
 Wife gives most of the history. The patient takes aricept and namenda at home. Will continue.

## 2024-06-12 NOTE — Assessment & Plan Note (Signed)
 Noted

## 2024-06-12 NOTE — ED Triage Notes (Signed)
 Patient presents with sob for a few days and chest pain that started through the night.   Patient is very hard of hearing

## 2024-06-12 NOTE — ED Notes (Signed)
 Paduchowski, MD, made aware of Afib heart rhythm in 140s.

## 2024-06-12 NOTE — ED Provider Notes (Signed)
 [facility] Provider Note    Event Date/Time   First MD Initiated Contact with Patient 06/12/24 1502     (approximate)  History   Chief Complaint: Chest Pain  HPI  Darryl Roman is a 89 y.o. male with a past medical history of hypertension, CVA, dementia, presents to the emergency department for several days of shortness of breath.  Wife also states she took his blood pressure this morning and it was running low so she had him come to the emergency department for further evaluation.  In the emergency department patient appears to be in atrial fibrillation with rapid ventricular response around 140 to 150 bpm.  Patient and wife deny any history of atrial fibrillation previously.  Physical Exam   Triage Vital Signs: ED Triage Vitals  Encounter Vitals Group     BP 06/12/24 1357 98/77     Girls Systolic BP Percentile --      Girls Diastolic BP Percentile --      Boys Systolic BP Percentile --      Boys Diastolic BP Percentile --      Pulse Rate 06/12/24 1357 100     Resp 06/12/24 1357 20     Temp 06/12/24 1357 97.9 F (36.6 C)     Temp Source 06/12/24 1357 Oral     SpO2 06/12/24 1357 94 %     Weight 06/12/24 1358 201 lb 6.4 oz (91.4 kg)     Height 06/12/24 1358 5' 9 (1.753 m)     Head Circumference --      Peak Flow --      Pain Score 06/12/24 1357 0     Pain Loc --      Pain Education --      Exclude from Growth Chart --     Most recent vital signs: Vitals:   06/12/24 1515 06/12/24 1530  BP:  112/69  Pulse: 64 (!) 107  Resp: 15 14  Temp:    SpO2: 99% 98%    General: Awake, no distress.  CV:  Good peripheral perfusion.  Irregular rhythm rate around 150 bpm. Resp:  Normal effort.  Equal breath sounds bilaterally.  Abd:  No distention.  Soft, nontender.  ED Results / Procedures / Treatments   EKG  EKG viewed and interpreted by myself shows atrial fibrillation with rapid ventricular response at 144 bpm with a narrow QRS, left axis  deviation, slight QTc prolongation otherwise normal intervals, nonspecific ST changes without ST elevation.  RADIOLOGY  Chest x-ray viewed and interpreted by myself shows no consolidation. Radiology is read the x-ray is negative.   MEDICATIONS ORDERED IN ED: Medications  sodium chloride  0.9 % bolus 1,000 mL (1,000 mLs Intravenous New Bag/Given 06/12/24 1536)  diltiazem  (CARDIZEM ) injection 10 mg (10 mg Intravenous Given 06/12/24 1535)     IMPRESSION / MDM / ASSESSMENT AND PLAN / ED COURSE  I reviewed the triage vital signs and the nursing notes.  Patient's presentation is most consistent with acute presentation with potential threat to life or bodily function.  Patient presents emergency department for shortness of breath over the last several days.  Patient found to be in atrial fibrillation with rapid ventricular response around 150 bpm.  No distress, well-appearing.  Patient's workup today shows a reassuring CBC with a normal white blood cell count normal H&H.  Patient's chemistry shows mild renal insufficiency with a creatinine of 1.37.  Patient's troponin is 42, slightly elevated however given his A-fib with  RVR highly suspect more demand ischemia than I do MI.  We will IV hydrate the patient given his borderline blood pressure currently 112/69.  Will dose 10 mg of IV diltiazem .  Will continue to closely monitor while awaiting further results.  Patient agreeable to plan of care.  Patient's heart rate has decreased from 150 down to currently 120 to 130 bpm remains in A-fib.  Blood pressure around 105 systolic currently.  We will start the patient on diltiazem  infusion we will admit to the hospital service for new onset A-fib and for further workup and treatment.  Repeat troponin reassuringly unchanged again most consistent with demand ischemia.  CRITICAL CARE Performed by: Franky Moores   Total critical care time: 30 minutes  Critical care time was exclusive of separately billable  procedures and treating other patients.  Critical care was necessary to treat or prevent imminent or life-threatening deterioration.  Critical care was time spent personally by me on the following activities: development of treatment plan with patient and/or surrogate as well as nursing, discussions with consultants, evaluation of patient's response to treatment, examination of patient, obtaining history from patient or surrogate, ordering and performing treatments and interventions, ordering and review of laboratory studies, ordering and review of radiographic studies, pulse oximetry and re-evaluation of patient's condition.   FINAL CLINICAL IMPRESSION(S) / ED DIAGNOSES   Dyspnea Atrial fibrillation with rapid ventricular response   Note:  This document was prepared using Dragon voice recognition software and may include unintentional dictation errors.   Moores Franky, MD 06/12/24 [phone]

## 2024-06-12 NOTE — Assessment & Plan Note (Signed)
 Noted. Patient takes losartan 25 mg daily at home. Not continued due to diltiazem  gtt.

## 2024-06-12 NOTE — Assessment & Plan Note (Signed)
Continue Crestor as at home.

## 2024-06-12 NOTE — H&P (Signed)
 History and Physical    Patient: Darryl Roman FMW:[id number] DOB: 03-May-1933 DOA: 06/12/2024 DOS: the patient was seen and examined on 06/12/2024 PCP: Rudolpho Norleen BIRCH, MD  Patient coming from: Home  Chief Complaint:  Chief Complaint  Patient presents with   Chest Pain   HPI: Darryl Roman is a 89 y.o. male with medical history significant for CVA, hypertension, hyperlipidemia, GERD, COPD, prostate cancer, alcohol use, dementia, and nephrolithiasis. The patient presents with 3 days of increased shortness of breath and weakness. His wife took his blood pressure this morning and found him to have a systolic pressure of 90 and a very high heart rate.    In the ED the patient was found to be in AF with RVR. HR was initially 150. He was given a diltiazem  bolus which brought his HR down to 125. He was then placed on a drip. He has been started on eliquis .   Creatinine was increased at 1.37. This is elevated over his baseline of 1.05. Troponin I is elevated at 42 and then 40.   CXR demonstrated COPD, and no acute changes.  EKG demonstrated atrial fibrillation with RVR.   Review of Systems: As mentioned in the history of present illness. All other systems reviewed and are negative. Past Medical History:  Diagnosis Date   History of kidney stones    Hypertension    Kidney stones    Prostate cancer (HCC)    Stroke [facility])    Past Surgical History:  Procedure Laterality Date   KIDNEY STONE SURGERY     MASS EXCISION Left 10/14/2019   Procedure: EXCISION SOFT TISSUE MAS OF PALM;  Surgeon: Edie Norleen PARAS, MD;  Location: [facility] ORS;  Service: Orthopedics;  Laterality: Left;   SHOULDER SURGERY     Social History:  reports that he has never smoked. He has never been exposed to tobacco smoke. He has never used smokeless tobacco. He reports that he does not drink alcohol and does not use drugs.  Allergies  Allergen Reactions   Sulfa Antibiotics Other (See Comments)    Family History   Problem Relation Age of Onset   Heart attack Mother     Prior to Admission medications   Medication Sig Start Date End Date Taking? Authorizing Provider  amLODipine (NORVASC) 10 MG tablet Take 1 tablet by mouth daily. 08/13/22   [provider]  Apoaequorin (PREVAGEN) 10 MG CAPS Take by mouth. Patient not taking: Reported on 06/10/2024    [provider]  aspirin EC 325 MG tablet Take 325 mg by mouth daily.    [provider]  baclofen  5 MG TABS Take 1 tablet (5 mg total) by mouth 2 (two) times daily as needed. Patient not taking: Reported on 06/10/2024 04/28/24   Arvis Jolan NOVAK, PA-C  celecoxib  (CELEBREX ) 100 MG capsule Take 1 capsule (100 mg total) by mouth 2 (two) times daily as needed for moderate pain (pain score 4-6). Patient not taking: Reported on 06/10/2024 04/28/24   Arvis Jolan B, PA-C  donepezil (ARICEPT) 5 MG tablet Take by mouth.    [provider]  fexofenadine  (ALLEGRA ) 60 MG tablet Take 1 tablet (60 mg total) by mouth 2 (two) times daily. Patient not taking: Reported on 06/10/2024 07/28/23   Benjamin, Lunise, NP  fluticasone  (FLONASE ) 50 MCG/ACT nasal spray Place 1 spray into both nostrils at bedtime as needed for allergies or rhinitis. 05/16/23   Raspet, Erin K, PA-C  lidocaine  (LIDODERM ) 5 % Place 1 patch  onto the skin daily. Remove & Discard patch within 12 hours or as directed by MD Patient not taking: Reported on 06/10/2024 04/28/24   Arvis Huxley B, PA-C  losartan (COZAAR) 25 MG tablet Take 25 mg by mouth daily.    [provider]  memantine (NAMENDA) 5 MG tablet Take 5 mg by mouth. 05/20/24 05/20/25  [provider]  Multiple Vitamin (MULTI-VITAMIN) tablet Take 1 tablet by mouth daily.    [provider]  predniSONE (DELTASONE) 20 MG tablet 2qam for 4 days, 1qam for 4days Patient not taking: Reported on 06/10/2024 05/26/24   [provider]  rosuvastatin (CRESTOR) 5 MG tablet Take 5 mg by mouth at  bedtime.    [provider]  triamcinolone  cream (KENALOG ) 0.1 % Apply 1 Application topically 2 (two) times daily. Patient not taking: Reported on 06/10/2024 07/28/23   Morene Morrow, NP  vitamin B-12 (CYANOCOBALAMIN) 1000 MCG tablet Take 1,000 mcg by mouth daily.    [provider]    Physical Exam: Vitals:   06/12/24 1800 06/12/24 1830 06/12/24 1900 06/12/24 1917  BP: 113/80 110/89 115/77   Pulse: (!) 135 69 87   Resp: 14 19 (!) 23   Temp:    (!) 97.4 F (36.3 C)  TempSrc:    Oral  SpO2: 96% 95% 95%   Weight:      Height:       Exam:  Constitutional:  The patient is awake, alert, and oriented x 3. No acute distress. Eyes:  pupils and irises appear normal Normal lids and conjunctivae ENMT:  grossly normal hearing  Lips appear normal external ears, nose appear normal Oropharynx: mucosa, tongue,posterior pharynx appear normal Neck:  neck appears normal, no masses, normal ROM, supple no thyromegaly Respiratory:  No increased work of breathing. No wheezes, rales, or rhonchi No tactile fremitus Cardiovascular:  Regular rate and rhythm No murmurs, ectopy, or gallups. No lateral PMI. No thrills. Abdomen:  Abdomen is soft, non-tender, non-distended No hernias, masses, or organomegaly Normoactive bowel sounds.  Musculoskeletal:  No cyanosis, clubbing, or edema Skin:  No rashes, lesions, ulcers palpation of skin: no induration or nodules Neurologic:  CN 2-12 intact Sensation all 4 extremities intact Psychiatric:  Unable to evaluate as patient is unable to cooperate with exam.  Data Reviewed:  CBC EKG CMP  Assessment and Plan: Atrial fibrillation with RVR (HCC) Heart rate of 150 upon presentation. Responded to diltiazem  bolus. Now on diltiazem  drip with HR of 107. He will receive eliquis  5 mg bid for stroke prophylaxis.  Hypertension Noted. Patient takes losartan 25 mg daily at home. Not continued due to diltiazem  gtt.  Dementia without  behavioral disturbance [facility]) Wife gives most of the history. The patient takes aricept and namenda at home. Will continue.  Prostate cancer (HCC) Noted.  Hyperlipidemia Continue Crestor as at home.  I have seen and examined this patient myself. I have spent 53 minutes in his evaluation and care.   Advance Care Planning:   Code Status: Full Code   Consults: None  Family Communication: Spouse at bedside  Severity of Illness: The appropriate patient status for this patient is INPATIENT. Inpatient status is judged to be reasonable and necessary in order to provide the required intensity of service to ensure the patient's safety. The patient's presenting symptoms, physical exam findings, and initial radiographic and laboratory data in the context of their chronic comorbidities is felt to place them at high risk for further clinical deterioration. Furthermore, it is not  anticipated that the patient will be medically stable for discharge from the hospital within 2 midnights of admission.   * I certify that at the point of admission it is my clinical judgment that the patient will require inpatient hospital care spanning beyond 2 midnights from the point of admission due to high intensity of service, high risk for further deterioration and high frequency of surveillance required.*  Author: Marquell Saenz, DO 06/12/2024 7:28 PM  For on call review [email or web address].

## 2024-06-12 NOTE — Assessment & Plan Note (Signed)
 Heart rate of 150 upon presentation. Responded to diltiazem  bolus. Now on diltiazem  drip with HR of 107. He will receive eliquis  5 mg bid for stroke prophylaxis.

## 2024-06-13 DIAGNOSIS — I4891 Unspecified atrial fibrillation: Secondary | ICD-10-CM | POA: Diagnosis not present

## 2024-06-13 LAB — COMPREHENSIVE METABOLIC PANEL WITH GFR
ALT: 19 U/L (ref 0–44)
AST: 20 U/L (ref 15–41)
Albumin: 3.1 g/dL — ABNORMAL LOW (ref 3.5–5.0)
Alkaline Phosphatase: 85 U/L (ref 38–126)
Anion gap: 11 (ref 5–15)
BUN: 20 mg/dL (ref 8–23)
CO2: 21 mmol/L — ABNORMAL LOW (ref 22–32)
Calcium: 8.4 mg/dL — ABNORMAL LOW (ref 8.9–10.3)
Chloride: 112 mmol/L — ABNORMAL HIGH (ref 98–111)
Creatinine, Ser: 1.17 mg/dL (ref 0.61–1.24)
GFR, Estimated: 60 mL/min — ABNORMAL LOW (ref >60)
Glucose, Bld: 112 mg/dL — ABNORMAL HIGH (ref 70–99)
Potassium: 3.5 mmol/L (ref 3.5–5.1)
Sodium: 144 mmol/L (ref 135–145)
Total Bilirubin: 0.9 mg/dL (ref 0.0–1.2)
Total Protein: 5.7 g/dL — ABNORMAL LOW (ref 6.5–8.1)

## 2024-06-13 LAB — CBC
HCT: 40 % (ref 39.0–52.0)
Hemoglobin: 14.4 g/dL (ref 13.0–17.0)
MCH: 33.3 pg (ref 26.0–34.0)
MCHC: 36 g/dL (ref 30.0–36.0)
MCV: 92.6 fL (ref 80.0–100.0)
Platelets: 186 K/uL (ref 150–400)
RBC: 4.32 MIL/uL (ref 4.22–5.81)
RDW: 12.7 % (ref 11.5–15.5)
WBC: 8.2 K/uL (ref 4.0–10.5)
nRBC: 0 % (ref 0.0–0.2)

## 2024-06-13 MED ORDER — DILTIAZEM HCL ER COATED BEADS 120 MG PO CP24
120.0000 mg | ORAL_CAPSULE | Freq: Every day | ORAL | Status: DC
  Administered 2024-06-13 – 2024-06-18 (×5): 120 mg via ORAL
  Filled 2024-06-13 (×7): qty 1

## 2024-06-13 MED ORDER — ARIPIPRAZOLE 10 MG PO TABS
10.0000 mg | ORAL_TABLET | Freq: Every day | ORAL | Status: DC
  Administered 2024-06-13 – 2024-06-18 (×5): 10 mg via ORAL
  Filled 2024-06-13 (×6): qty 1

## 2024-06-13 MED ORDER — ZIPRASIDONE MESYLATE 20 MG IM SOLR
10.0000 mg | Freq: Four times a day (QID) | INTRAMUSCULAR | Status: DC | PRN
  Administered 2024-06-14: 10 mg via INTRAMUSCULAR
  Filled 2024-06-13 (×3): qty 20

## 2024-06-13 MED ORDER — MIRTAZAPINE 15 MG PO TABS
7.5000 mg | ORAL_TABLET | Freq: Every evening | ORAL | Status: DC | PRN
  Administered 2024-06-13 – 2024-06-15 (×2): 7.5 mg via ORAL
  Filled 2024-06-13 (×2): qty 1

## 2024-06-13 MED ORDER — ZIPRASIDONE MESYLATE 20 MG IM SOLR
20.0000 mg | Freq: Three times a day (TID) | INTRAMUSCULAR | Status: DC | PRN
  Administered 2024-06-13: 20 mg via INTRAMUSCULAR
  Filled 2024-06-13: qty 20

## 2024-06-13 MED ORDER — ZIPRASIDONE MESYLATE 20 MG IM SOLR
10.0000 mg | Freq: Two times a day (BID) | INTRAMUSCULAR | Status: DC | PRN
  Administered 2024-06-13: 10 mg via INTRAMUSCULAR
  Filled 2024-06-13: qty 20

## 2024-06-13 NOTE — Care Management Obs Status (Signed)
 MEDICARE OBSERVATION STATUS NOTIFICATION   Patient Details  Name: Darryl Roman MRN: [id number] Date of Birth: 1933/06/06   Medicare Observation Status Notification Given:  Yes    [name], LCSW 06/13/2024, 6:09 PM

## 2024-06-13 NOTE — ED Notes (Addendum)
 Pt wife at nurse station asking for medications to relax pt. Pt is red in the face and attempting to leave bed. Pt is restless and wants to get out of bed. Wife and son at bed side attempting to redirect pt. MD notified.

## 2024-06-13 NOTE — ED Notes (Signed)
 Pt repositioned by this RN. Water provided to pt and wife. Pt is calm and cooperative. NAD. CB within reach.

## 2024-06-13 NOTE — ED Notes (Signed)
 Pt is asleep. Even rise and fall of chest noted. Family at bedside

## 2024-06-13 NOTE — ED Notes (Signed)
 Pt responsive to painful stimuli, even rise and fall of chest noted. Vitals WDL. Will hold po meds until pt is alert enough to swallow. Family at bedside updated on plan of care. No further needs expressed.

## 2024-06-13 NOTE — Care Management CC44 (Signed)
 Condition Code 44 Documentation Completed  Patient Details  Name: Darryl Roman MRN: [id number] Date of Birth: [birth year]   Condition Code 44 given:  Yes Patient signature on Condition Code 44 notice:  Yes Documentation of 2 MD's agreement:  Yes Code 44 added to claim:  Yes    [name], LCSW 06/13/2024, 6:09 PM

## 2024-06-13 NOTE — Progress Notes (Signed)
 Patient has arrived on the unit. Unable to follow commands. Placed from stretcher to bed. Anxious, hallucinations,  unable to answer yes/ no questions. Will not drink water to take his medication.  Patient wife and son are at the bedside.

## 2024-06-13 NOTE — TOC CM/SW Note (Signed)
..  Transition of Care [facility]) - Inpatient Brief Assessment   Patient Details  Name: Darryl Roman MRN: [id number] Date of Birth: June 27, 1933  Transition of Care [facility]) CM/SW Contact:    [name], LCSW Phone Number: 06/13/2024, 6:09 PM   Clinical Narrative: Pt is not able to sign CD44.  TOC reached out to wife ,no answer.  TOC left message with TOC contact information  Transition of Care Asessment:

## 2024-06-13 NOTE — ED Notes (Signed)
 Pt cleaned and provided pericare after a episode of urine incontinence. Pt repositioned. Tolerated well. NAD. Wife and son at bedside.

## 2024-06-13 NOTE — ED Notes (Signed)
 This RN and Jacquline EDT, did a full linen change and provided pericare after pt had a episode of urine incontinence. Pt tolerated well and repositioned.

## 2024-06-13 NOTE — Progress Notes (Signed)
 Progress Note   Patient: Darryl Roman FMW:[id number] DOB: 02/14/1933 DOA: 06/12/2024     1 DOS: the patient was seen and examined on 06/13/2024   Brief hospital course: From HPI Darryl Roman is a 89 y.o. male with medical history significant for CVA, hypertension, hyperlipidemia, GERD, COPD, prostate cancer, alcohol use, dementia, and nephrolithiasis. The patient presents with 3 days of increased shortness of breath and weakness. His wife took his blood pressure this morning and found him to have a systolic pressure of 90 and a very high heart rate.     In the ED the patient was found to be in AF with RVR. HR was initially 150. He was given a diltiazem  bolus which brought his HR down to 125. He was then placed on a drip. He has been started on eliquis .    Creatinine was increased at 1.37. This is elevated over his baseline of 1.05. Troponin I is elevated at 42 and then 40.    CXR demonstrated COPD, and no acute changes.   EKG demonstrated atrial fibrillation with RVR.    Assessment and Plan:  New onset atrial fibrillation with RVR (HCC) Currently rate controlled I discussed Eliquis  with the patient's family and they have agreed Was on Cardizem  drip which has been switched to oral diltiazem    Hypertension Continue diltiazem    Dementia without behavioral disturbance (HCC) According to nursing staff patient noted to have some agitation this morning Continue daily aripiprazole  As needed Geodon   added   Prostate cancer (HCC) Noted.   Hyperlipidemia Continue Crestor as at home.     Advance Care Planning:   Code Status: Full Code    Consults: None   Family Communication: Spouse at bedside  Subjective:  Patient seen and examined at bedside this morning Patient was resting comfortably in bed Patient's wife as well as son at bedside Order of Geodon  to help patient calm down was ordered in the morning. I discussed with nursing staff about delirium precaution as according  to the wife patient is not agitated normally at home.  Physical Exam:   Constitutional:  The patient is awake, alert, and oriented x 3. No acute distress. Eyes:  pupils and irises appear normal Normal lids and conjunctivae ENMT:  grossly normal hearing  Lips appear normal external ears, nose appear normal Oropharynx: mucosa, tongue,posterior pharynx appear normal Neck:  neck appears normal, no masses, normal ROM, supple no thyromegaly Respiratory:  No increased work of breathing. No wheezes, rales, or rhonchi No tactile fremitus Cardiovascular:  Regular rate and rhythm No murmurs, ectopy, or gallups. No lateral PMI. No thrills. Abdomen:  Abdomen is soft, non-tender, non-distended No hernias, masses, or organomegaly Normoactive bowel sounds.  Musculoskeletal:  No cyanosis, clubbing, or edema Skin:  No rashes, lesions, ulcers palpation of skin: no induration or nodules Neurologic:  CN 2-12 intact Sensation all 4 extremities intact Psychiatric:  Unable to evaluate as patient is unable to cooperate with exam.    Vitals:   06/13/24 0951 06/13/24 1130 06/13/24 1136 06/13/24 1330  BP: (!) 165/97 (!) 184/85  (!) 156/85  Pulse:      Resp:  (!) 22  12  Temp:   98.3 F (36.8 C)   TempSrc:   Oral   SpO2:  100%    Weight:      Height:        Data Reviewed:  Chest x-ray did not show any intrapulmonary pathology    Latest Ref Rng & Units 06/13/2024  3:52 AM 06/12/2024    2:04 PM 06/10/2024    2:06 PM  CBC  WBC 4.0 - 10.5 K/uL 8.2  9.4  8.6   Hemoglobin 13.0 - 17.0 g/dL 85.5  84.4  84.6   Hematocrit 39.0 - 52.0 % 40.0  44.1  42.9   Platelets 150 - 400 K/uL 186  239  204        Latest Ref Rng & Units 06/13/2024    3:52 AM 06/12/2024    2:04 PM 06/10/2024    2:06 PM  BMP  Glucose 70 - 99 mg/dL 887  97  851   BUN 8 - 23 mg/dL 20  20  18    Creatinine 0.61 - 1.24 mg/dL 8.82  8.62  8.94   Sodium 135 - 145 mmol/L 144  142  139   Potassium 3.5 - 5.1 mmol/L 3.5  4.0   3.8   Chloride 98 - 111 mmol/L 112  111  108   CO2 22 - 32 mmol/L 21  23  22    Calcium 8.9 - 10.3 mg/dL 8.4  8.9  8.8        Author: Drue ONEIDA Potter, MD 06/13/2024 3:16 PM  For on call review [email or web address].

## 2024-06-13 NOTE — ED Notes (Signed)
 RN to bedside to introduce self to pt and family. Pt is hot to the touch and will not wake up to voice but responds to pain. Pt had geodon  earlier per offcoming RN. Pt unable to wake up and unable to take PO meds.

## 2024-06-13 NOTE — ED Notes (Signed)
 Pt attempting to get out of bed. Pt redirectable. Wife at bedside.

## 2024-06-13 NOTE — ED Notes (Signed)
 Pt repositioned by this RN and assisted with urinal. Teach back method used when teaching wife how to assist with urinal. Lights turned on in room. NAD. CB within reach

## 2024-06-13 NOTE — ED Notes (Signed)
 Pt restless, attempting to get out of bed. NAD.

## 2024-06-13 NOTE — ED Notes (Signed)
 Made the MD aware that pt is agitated and needs medications for comfort. No new order

## 2024-06-14 ENCOUNTER — Observation Stay

## 2024-06-14 DIAGNOSIS — I4891 Unspecified atrial fibrillation: Secondary | ICD-10-CM | POA: Diagnosis not present

## 2024-06-14 LAB — BASIC METABOLIC PANEL WITH GFR
Anion gap: 10 (ref 5–15)
BUN: 16 mg/dL (ref 8–23)
CO2: 22 mmol/L (ref 22–32)
Calcium: 8.9 mg/dL (ref 8.9–10.3)
Chloride: 111 mmol/L (ref 98–111)
Creatinine, Ser: 1.02 mg/dL (ref 0.61–1.24)
GFR, Estimated: >60 mL/min (ref >60)
Glucose, Bld: 131 mg/dL — ABNORMAL HIGH (ref 70–99)
Potassium: 3.9 mmol/L (ref 3.5–5.1)
Sodium: 143 mmol/L (ref 135–145)

## 2024-06-14 LAB — THYROID PANEL WITH TSH
Free Thyroxine Index: 2.7 (ref 1.2–4.9)
T3 Uptake Ratio: 34 % (ref 24–39)
T4, Total: 7.9 ug/dL (ref 4.5–12.0)
TSH: 2.82 u[IU]/mL (ref 0.450–4.500)

## 2024-06-14 LAB — CBC WITH DIFFERENTIAL/PLATELET
Abs Immature Granulocytes: 0.06 K/uL (ref 0.00–0.07)
Basophils Absolute: 0 K/uL (ref 0.0–0.1)
Basophils Relative: 0 %
Eosinophils Absolute: 0.1 K/uL (ref 0.0–0.5)
Eosinophils Relative: 1 %
HCT: 44.9 % (ref 39.0–52.0)
Hemoglobin: 16 g/dL (ref 13.0–17.0)
Immature Granulocytes: 1 %
Lymphocytes Relative: 13 %
Lymphs Abs: 1.5 K/uL (ref 0.7–4.0)
MCH: 33.1 pg (ref 26.0–34.0)
MCHC: 35.6 g/dL (ref 30.0–36.0)
MCV: 92.8 fL (ref 80.0–100.0)
Monocytes Absolute: 1.2 K/uL — ABNORMAL HIGH (ref 0.1–1.0)
Monocytes Relative: 11 %
Neutro Abs: 9 K/uL — ABNORMAL HIGH (ref 1.7–7.7)
Neutrophils Relative %: 74 %
Platelets: 221 K/uL (ref 150–400)
RBC: 4.84 MIL/uL (ref 4.22–5.81)
RDW: 12.6 % (ref 11.5–15.5)
WBC: 11.9 K/uL — ABNORMAL HIGH (ref 4.0–10.5)
nRBC: 0 % (ref 0.0–0.2)

## 2024-06-14 MED ORDER — DILTIAZEM HCL ER COATED BEADS 120 MG PO CP24: 120.0000 mg | ORAL_CAPSULE | Freq: Every day | ORAL | 1 refills | Status: DC

## 2024-06-14 MED ORDER — POLYETHYLENE GLYCOL 3350 17 G PO PACK: 17.0000 g | PACK | Freq: Every day | ORAL | 0 refills | Status: DC | PRN

## 2024-06-14 MED ORDER — SODIUM CHLORIDE 0.9 % IV SOLN
1.0000 g | INTRAVENOUS | Status: AC
  Administered 2024-06-14 – 2024-06-18 (×5): 1 g via INTRAVENOUS
  Filled 2024-06-14 (×6): qty 10

## 2024-06-14 MED ORDER — MIRTAZAPINE 7.5 MG PO TABS: 7.5000 mg | ORAL_TABLET | Freq: Every evening | ORAL | 0 refills | Status: DC | PRN

## 2024-06-14 MED ORDER — APIXABAN 5 MG PO TABS: 5.0000 mg | ORAL_TABLET | Freq: Two times a day (BID) | ORAL | 3 refills | Status: DC

## 2024-06-14 MED ORDER — LIDOCAINE 5 % EX PTCH
1.0000 | MEDICATED_PATCH | CUTANEOUS | Status: DC
  Administered 2024-06-14 – 2024-06-17 (×4): 1 via TRANSDERMAL
  Filled 2024-06-14 (×5): qty 1

## 2024-06-14 MED ORDER — ZIPRASIDONE MESYLATE 20 MG IM SOLR
10.0000 mg | Freq: Once | INTRAMUSCULAR | Status: AC
Start: 2024-06-14 — End: 2024-06-14
  Administered 2024-06-14: 10 mg via INTRAMUSCULAR
  Filled 2024-06-14: qty 20

## 2024-06-14 NOTE — Evaluation (Signed)
 Occupational Therapy Evaluation Patient Details Name: Darryl Roman MRN: [id number] DOB: Feb 25, 1933 Today's Date: 06/14/2024   History of Present Illness   Pt is an 89 y/o M presenting to ED with c/o SOB, chest pain. Workup for new onset afib with RVR. PMH significant for HTN, CVA, dementia, HLD, GERD, COPD, prostate cancer.    Clinical Impressions Mr Loughner was seen for OT evaluation this date. Prior to hospital admission, pt was IND with PRN use of SPC. Pt lives with spouse, sons live nearby. On arrival pt sleeping soundly, alternating lethargic and combative t/o session suspect r/t medication.  Pt currently requires TOTAL A face washing and self-drinking at bed in chair position - pt resistive to all ADL and mobility attmepts including swatting and spitting out water. TOTAL A to initiate roll at bed level to reposition in bed. Pt would benefit from skilled OT to address noted impairments and functional limitations (see below for any additional details). Upon hospital discharge, recommend OT follow up.    If plan is discharge home, recommend the following:   Two people to help with walking and/or transfers;Two people to help with bathing/dressing/bathroom;Supervision due to cognitive status     Functional Status Assessment   Patient has had a recent decline in their functional status and demonstrates the ability to make significant improvements in function in a reasonable and predictable amount of time.     Equipment Recommendations   Other (comment) (defer)     Recommendations for Other Services         Precautions/Restrictions   Precautions Precautions: Fall Recall of Precautions/Restrictions: Impaired Restrictions Weight Bearing Restrictions Per Provider Order: No     Mobility Bed Mobility Overal bed mobility: Needs Assistance Bed Mobility: Rolling Rolling: Total assist         General bed mobility comments: resistive    Transfers                    General transfer comment: unsafe to attempt          ADL either performed or assessed with clinical judgement   ADL Overall ADL's : Needs assistance/impaired                                       General ADL Comments: TOTAL A face washing and self-drinking at bed in chair position - pt resistive to all ADL and mobility attmepts including swatting and spitting out water      Pertinent Vitals/Pain Pain Assessment Pain Assessment: PAINAD Breathing: normal Negative Vocalization: none Facial Expression: smiling or inexpressive Body Language: rigid, fists clenched, knees up, pushing/pulling away, strikes out Consolability: distracted or reassured by voice/touch PAINAD Score: 3 Pain Intervention(s): Limited activity within patient's tolerance, Repositioned     Extremity/Trunk Assessment Upper Extremity Assessment Upper Extremity Assessment: Difficult to assess due to impaired cognition;RUE deficits/detail;LUE deficits/detail RUE Deficits / Details: 5/5 strength LUE Deficits / Details: 5/5 strength   Lower Extremity Assessment Lower Extremity Assessment: Defer to PT evaluation       Communication Communication Communication: Impaired Factors Affecting Communication: Difficulty expressing self;Hearing impaired   Cognition Arousal: Lethargic, Suspect due to medications Behavior During Therapy: Agitated Cognition: Difficult to assess Difficult to assess due to: Impaired communication, Level of arousal           OT - Cognition Comments: eyes closed intermittently t/o session, pt swinging nad combative  Following commands: Impaired       Cueing  General Comments   Cueing Techniques: Verbal cues;Tactile cues;Visual cues      Exercises     Shoulder Instructions      Home Living Family/patient expects to be discharged to:: Private residence Living Arrangements: Spouse/significant other Available Help at  Discharge: Family;Available 24 hours/day Type of Home: House Home Access: Stairs to enter [employer] of Steps: 2+1 Entrance Stairs-Rails: None Home Layout: One level               Home Equipment: Cane - single point          Prior Functioning/Environment Prior Level of Function : Independent/Modified Independent             Mobility Comments: per pt's family, he is typically modI with amb, intermittent SPC use ADLs Comments: per pt's family, he is typically IND with ADLs at baseline    OT Problem List: Decreased strength;Decreased range of motion;Decreased activity tolerance;Impaired balance (sitting and/or standing);Decreased safety awareness   OT Treatment/Interventions: Self-care/ADL training;Therapeutic exercise;Energy conservation;DME and/or AE instruction;Therapeutic activities;Patient/family education;Balance training;Cognitive remediation/compensation      OT Goals(Current goals can be found in the care plan section)   Acute Rehab OT Goals Patient Stated Goal: to go home OT Goal Formulation: With family Time For Goal Achievement: 06/28/24 Potential to Achieve Goals: Fair ADL Goals Pt Will Perform Grooming: with min assist;sitting Pt Will Perform Lower Body Dressing: with min assist;sitting/lateral leans;with caregiver independent in assisting Pt Will Transfer to Toilet: with min assist;stand pivot transfer;bedside commode   OT Frequency:  Min 2X/week    Co-evaluation PT/OT/SLP Co-Evaluation/Treatment: Yes Reason for Co-Treatment: For patient/therapist safety;To address functional/ADL transfers PT goals addressed during session: Mobility/safety with mobility OT goals addressed during session: ADL's and self-care      AM-PAC OT 6 Clicks Daily Activity     Outcome Measure Help from another person eating meals?: A Lot Help from another person taking care of personal grooming?: Total Help from another person toileting, which includes  using toliet, bedpan, or urinal?: Total Help from another person bathing (including washing, rinsing, drying)?: Total Help from another person to put on and taking off regular upper body clothing?: Total Help from another person to put on and taking off regular lower body clothing?: Total 6 Click Score: 7   End of Session Nurse Communication: Mobility status  Activity Tolerance: Treatment limited secondary to agitation Patient left: in bed;with call bell/phone within reach;with bed alarm set;with family/visitor present  OT Visit Diagnosis: Other abnormalities of gait and mobility (R26.89);Muscle weakness (generalized) (M62.81)                Time: 8869-8851 OT Time Calculation (min): 18 min Charges:  OT General Charges $OT Visit: 1 Visit OT Evaluation $OT Eval Low Complexity: 1 Low  Elston Slot, M.S. OTR/L  06/14/24, 1:57 PM  ascom [phone]

## 2024-06-14 NOTE — Progress Notes (Signed)
 Progress Note   Patient: Darryl Roman FMW:[id number] DOB: 11-20-1932 DOA: 06/12/2024     1 DOS: the patient was seen and examined on 06/14/2024     Brief hospital course: From HPI Darryl Roman is a 89 y.o. male with medical history significant for CVA, hypertension, hyperlipidemia, GERD, COPD, prostate cancer, alcohol use, dementia, and nephrolithiasis. The patient presents with 3 days of increased shortness of breath and weakness. His wife took his blood pressure this morning and found him to have a systolic pressure of 90 and a very high heart rate.     In the ED the patient was found to be in AF with RVR. HR was initially 150. He was given a diltiazem  bolus which brought his HR down to 125. He was then placed on a drip. He has been started on eliquis .    Creatinine was increased at 1.37. This is elevated over his baseline of 1.05. Troponin I is elevated at 42 and then 40.    CXR demonstrated COPD, and no acute changes.   EKG demonstrated atrial fibrillation with RVR.     Assessment and Plan:   New onset atrial fibrillation with RVR (HCC) Currently rate controlled I discussed Eliquis  with the patient's family and they have agreed Was on Cardizem  drip which has been switched to oral diltiazem  Continue to monitor closely  Acute delirium Patient continues to remain confused Continue delirium precaution UA requested  Hypertension Continue diltiazem    Dementia behavioral disturbance (HCC) According to nursing staff patient noted to have some agitation this morning Continue daily aripiprazole  Geodon  discontinued We will avoid sedating medication   Prostate cancer (HCC) Noted.   Hyperlipidemia Continue Crestor as at home.      Advance Care Planning:   Code Status: Full Code    Consults: None   Family Communication: Spouse at bedside   Disposition: PT OT recommends skilled nursing facility  Subjective:  Patient confused likely secondary to delirium as well as  medication effects Denies nausea vomiting abdominal pain chest pain cough PT recommends skilled nursing facility  Physical Exam:    Constitutional:  Elderly male laying in bed confused and disoriented Respiratory:  No increased work of breathing. No wheezes, rales, or rhonchi No tactile fremitus Cardiovascular:  Regular rate and rhythm No murmurs, ectopy, or gallups. No lateral PMI. No thrills. Abdomen:  Abdomen is soft, non-tender, non-distended No hernias, masses, or organomegaly Normoactive bowel sounds.  Musculoskeletal:  No cyanosis, clubbing, or edema Skin:  No rashes, lesions, ulcers palpation of skin: no induration or nodules Neurologic: Confused   Data Reviewed:   Chest x-ray did not show any intrapulmonary pathology    Latest Ref Rng & Units 06/14/2024    4:33 AM 06/13/2024    3:52 AM 06/12/2024    2:04 PM  CBC  WBC 4.0 - 10.5 K/uL 11.9  8.2  9.4   Hemoglobin 13.0 - 17.0 g/dL 83.9  85.5  84.4   Hematocrit 39.0 - 52.0 % 44.9  40.0  44.1   Platelets 150 - 400 K/uL 221  186  239        Latest Ref Rng & Units 06/14/2024    4:33 AM 06/13/2024    3:52 AM 06/12/2024    2:04 PM  BMP  Glucose 70 - 99 mg/dL 868  887  97   BUN 8 - 23 mg/dL 16  20  20    Creatinine 0.61 - 1.24 mg/dL 8.97  8.82  8.62   Sodium 135 -  145 mmol/L 143  144  142   Potassium 3.5 - 5.1 mmol/L 3.9  3.5  4.0   Chloride 98 - 111 mmol/L 111  112  111   CO2 22 - 32 mmol/L 22  21  23    Calcium 8.9 - 10.3 mg/dL 8.9  8.4  8.9     Vitals:   06/13/24 2200 06/13/24 2334 06/14/24 0915 06/14/24 1228  BP: (!) 149/76 (!) 176/78 (!) 151/103 112/78  Pulse: 75 83 93 87  Resp: 20 17 20  (!) 22  Temp:  98 F (36.7 C) 98.2 F (36.8 C) 98.9 F (37.2 C)  TempSrc:  Oral Oral Oral  SpO2: 98% 96%    Weight:      Height:         Author: Drue ONEIDA Potter, MD 06/14/2024 4:05 PM  For on call review [email or web address].

## 2024-06-14 NOTE — Plan of Care (Signed)

## 2024-06-14 NOTE — Progress Notes (Addendum)
 Patient continues restless, Will not allow blood pressure to be taken. Sleeping intermittently. SABRA Has not had a drink of water since arriving on this unit. Refuses.

## 2024-06-14 NOTE — Progress Notes (Signed)
 Patient later today noted to have temperature of 100.7 as well as increased respiratory rate.  UA is pending.  Given altered mental status patient may have possible UTI.  Ceftriaxone  initiated.  Repeat chest x-ray ordered and blood cultures requested.

## 2024-06-14 NOTE — Evaluation (Signed)
 Physical Therapy Evaluation Patient Details Name: Darryl Roman MRN: [id number] DOB: [birth year] Today's Date: 06/14/2024  History of Present Illness  Pt is an 89 y/o M presenting to ED with c/o SOB, chest pain. Workup for new onset afib with RVR. PMH significant for HTN, CVA, dementia, HLD, GERD, COPD, prostate cancer.  Clinical Impression  Pt lethargic but does arouse to name/touch, more responsive to family but agitated and combative throughout most of evaluation attempt. Per pt's family, at baseline he is ambulatory with intermittent SPC use and IND with basic ADLs. Pt unable to follow simple one step commands, BLE resisitve to any movement attempts. A few ADLs attempted with max-totalA, pt remained agitated. Additional mobility deferred at this time for patient/therapist safety. Pt was left supine in bed, supportive family at bedside.The patient would benefit from further skilled PT intervention for continued assessment of mobility and ability to participate with services.       If plan is discharge home, recommend the following: Two people to help with walking and/or transfers;Two people to help with bathing/dressing/bathroom;Direct supervision/assist for medications management;Assist for transportation;Supervision due to cognitive status   Can travel by private vehicle   No    Equipment Recommendations Other (comment) (TBD)  Recommendations for Other Services       Functional Status Assessment Patient has had a recent decline in their functional status and demonstrates the ability to make significant improvements in function in a reasonable and predictable amount of time.     Precautions / Restrictions Precautions Precautions: Fall Recall of Precautions/Restrictions: Impaired Restrictions Weight Bearing Restrictions Per Provider Order: No      Mobility  Bed Mobility               General bed mobility comments: not attempted due to pt agitation with BLE movement     Transfers                        Ambulation/Gait                  Stairs            Wheelchair Mobility     Tilt Bed    Modified Rankin (Stroke Patients Only)       Balance                                             Pertinent Vitals/Pain Pain Assessment Pain Assessment: PAINAD Breathing: normal Negative Vocalization: occasional moan/groan, low speech, negative/disapproving quality Facial Expression: smiling or inexpressive Body Language: rigid, fists clenched, knees up, pushing/pulling away, strikes out Consolability: distracted or reassured by voice/touch PAINAD Score: 4 Pain Intervention(s): Limited activity within patient's tolerance, Monitored during session, Repositioned    Home Living Family/patient expects to be discharged to:: Private residence Living Arrangements: Spouse/significant other Available Help at Discharge: Family;Available 24 hours/day Type of Home: House           Home Equipment: [place] - single point      Prior Function Prior Level of Function : Independent/Modified Independent             Mobility Comments: per pt's family, he is typically modI with amb, intermittent SPC use ADLs Comments: per pt's family, he is typically IND with ADLs at baseline     Extremity/Trunk Assessment   Upper Extremity Assessment Upper Extremity Assessment:  Defer to OT evaluation    Lower Extremity Assessment Lower Extremity Assessment: Difficult to assess due to impaired cognition (BLE appear strong, pt actively resistive to movement of BLE, pain with movement of LLE)       Communication   Communication Communication: Impaired Factors Affecting Communication: Difficulty expressing self;Hearing impaired    Cognition Arousal: Lethargic, Suspect due to medications Behavior During Therapy: Agitated   PT - Cognitive impairments: Difficult to assess Difficult to assess due to: Level of arousal                      PT - Cognition Comments: Pt very lethargic but arouses to name/touch, combative with attempts at mobility. Unable to follow commands Following commands: Impaired Following commands impaired: Follows one step commands inconsistently     Cueing Cueing Techniques: Verbal cues, Tactile cues, Visual cues     General Comments      Exercises     Assessment/Plan    PT Assessment Patient needs continued PT services  PT Problem List         PT Treatment Interventions DME instruction;Gait training;Functional mobility training;Therapeutic activities;Therapeutic exercise;Balance training;Neuromuscular re-education;Cognitive remediation;Patient/family education    PT Goals (Current goals can be found in the Care Plan section)  Acute Rehab PT Goals Patient Stated Goal: none stated by pt PT Goal Formulation: Patient unable to participate in goal setting Time For Goal Achievement: 06/28/24 Potential to Achieve Goals: Fair    Frequency Min 2X/week     Co-evaluation PT/OT/SLP Co-Evaluation/Treatment: Yes Reason for Co-Treatment: For patient/therapist safety;To address functional/ADL transfers PT goals addressed during session: Mobility/safety with mobility         AM-PAC PT 6 Clicks Mobility  Outcome Measure Help needed turning from your back to your side while in a flat bed without using bedrails?: Total Help needed moving from lying on your back to sitting on the side of a flat bed without using bedrails?: Total Help needed moving to and from a bed to a chair (including a wheelchair)?: Total Help needed standing up from a chair using your arms (e.g., wheelchair or bedside chair)?: Total Help needed to walk in hospital room?: Total Help needed climbing 3-5 steps with a railing? : Total 6 Click Score: 6    End of Session   Activity Tolerance: Treatment limited secondary to agitation Patient left: in bed;with call bell/phone within reach;with bed alarm  set;with family/visitor present Nurse Communication: Mobility status PT Visit Diagnosis: Other abnormalities of gait and mobility (R26.89);Difficulty in walking, not elsewhere classified (R26.2)    Time: 1130-1146 PT Time Calculation (min) (ACUTE ONLY): 16 min   Charges:   PT Evaluation $PT Eval Moderate Complexity: 1 Mod   PT General Charges $$ ACUTE PT VISIT: 1 Visit         Yuki Purves, SPT

## 2024-06-15 ENCOUNTER — Other Ambulatory Visit: Payer: Self-pay

## 2024-06-15 ENCOUNTER — Inpatient Hospital Stay

## 2024-06-15 ENCOUNTER — Other Ambulatory Visit: Payer: Self-pay | Admitting: Oncology

## 2024-06-15 ENCOUNTER — Telehealth: Payer: Self-pay

## 2024-06-15 DIAGNOSIS — Z882 Allergy status to sulfonamides status: Secondary | ICD-10-CM | POA: Diagnosis not present

## 2024-06-15 DIAGNOSIS — F03918 Unspecified dementia, unspecified severity, with other behavioral disturbance: Secondary | ICD-10-CM | POA: Diagnosis present

## 2024-06-15 DIAGNOSIS — J440 Chronic obstructive pulmonary disease with acute lower respiratory infection: Secondary | ICD-10-CM | POA: Diagnosis not present

## 2024-06-15 DIAGNOSIS — Z79899 Other long term (current) drug therapy: Secondary | ICD-10-CM | POA: Diagnosis not present

## 2024-06-15 DIAGNOSIS — Z7982 Long term (current) use of aspirin: Secondary | ICD-10-CM | POA: Diagnosis not present

## 2024-06-15 DIAGNOSIS — I10 Essential (primary) hypertension: Secondary | ICD-10-CM | POA: Diagnosis present

## 2024-06-15 DIAGNOSIS — A419 Sepsis, unspecified organism: Secondary | ICD-10-CM | POA: Diagnosis not present

## 2024-06-15 DIAGNOSIS — J159 Unspecified bacterial pneumonia: Secondary | ICD-10-CM | POA: Diagnosis not present

## 2024-06-15 DIAGNOSIS — Z7901 Long term (current) use of anticoagulants: Secondary | ICD-10-CM | POA: Diagnosis not present

## 2024-06-15 DIAGNOSIS — I4891 Unspecified atrial fibrillation: Secondary | ICD-10-CM | POA: Diagnosis present

## 2024-06-15 DIAGNOSIS — C61 Malignant neoplasm of prostate: Secondary | ICD-10-CM | POA: Diagnosis present

## 2024-06-15 DIAGNOSIS — Z8673 Personal history of transient ischemic attack (TIA), and cerebral infarction without residual deficits: Secondary | ICD-10-CM | POA: Diagnosis not present

## 2024-06-15 DIAGNOSIS — I159 Secondary hypertension, unspecified: Secondary | ICD-10-CM | POA: Diagnosis not present

## 2024-06-15 DIAGNOSIS — Z8249 Family history of ischemic heart disease and other diseases of the circulatory system: Secondary | ICD-10-CM | POA: Diagnosis not present

## 2024-06-15 DIAGNOSIS — M1712 Unilateral primary osteoarthritis, left knee: Secondary | ICD-10-CM | POA: Diagnosis present

## 2024-06-15 DIAGNOSIS — E785 Hyperlipidemia, unspecified: Secondary | ICD-10-CM | POA: Diagnosis present

## 2024-06-15 LAB — URINALYSIS, COMPLETE (UACMP) WITH MICROSCOPIC
Bacteria, UA: NONE SEEN
Bilirubin Urine: NEGATIVE
Glucose, UA: NEGATIVE mg/dL
Hgb urine dipstick: NEGATIVE
Ketones, ur: 20 mg/dL — AB
Leukocytes,Ua: NEGATIVE
Nitrite: NEGATIVE
Protein, ur: 30 mg/dL — AB
Specific Gravity, Urine: 1.028 (ref 1.005–1.030)
pH: 5 (ref 5.0–8.0)

## 2024-06-15 LAB — CBC WITH DIFFERENTIAL/PLATELET
Abs Immature Granulocytes: 0.09 K/uL — ABNORMAL HIGH (ref 0.00–0.07)
Basophils Absolute: 0 K/uL (ref 0.0–0.1)
Basophils Relative: 0 %
Eosinophils Absolute: 0 K/uL (ref 0.0–0.5)
Eosinophils Relative: 0 %
HCT: 42.9 % (ref 39.0–52.0)
Hemoglobin: 14.8 g/dL (ref 13.0–17.0)
Immature Granulocytes: 1 %
Lymphocytes Relative: 11 %
Lymphs Abs: 1.3 K/uL (ref 0.7–4.0)
MCH: 32.6 pg (ref 26.0–34.0)
MCHC: 34.5 g/dL (ref 30.0–36.0)
MCV: 94.5 fL (ref 80.0–100.0)
Monocytes Absolute: 1.5 K/uL — ABNORMAL HIGH (ref 0.1–1.0)
Monocytes Relative: 14 %
Neutro Abs: 8.1 K/uL — ABNORMAL HIGH (ref 1.7–7.7)
Neutrophils Relative %: 74 %
Platelets: 210 K/uL (ref 150–400)
RBC: 4.54 MIL/uL (ref 4.22–5.81)
RDW: 12.7 % (ref 11.5–15.5)
WBC: 11 K/uL — ABNORMAL HIGH (ref 4.0–10.5)
nRBC: 0 % (ref 0.0–0.2)

## 2024-06-15 LAB — BASIC METABOLIC PANEL WITH GFR
Anion gap: 9 (ref 5–15)
BUN: 24 mg/dL — ABNORMAL HIGH (ref 8–23)
CO2: 21 mmol/L — ABNORMAL LOW (ref 22–32)
Calcium: 8.4 mg/dL — ABNORMAL LOW (ref 8.9–10.3)
Chloride: 112 mmol/L — ABNORMAL HIGH (ref 98–111)
Creatinine, Ser: 1.14 mg/dL (ref 0.61–1.24)
GFR, Estimated: >60 mL/min (ref >60)
Glucose, Bld: 137 mg/dL — ABNORMAL HIGH (ref 70–99)
Potassium: 3.5 mmol/L (ref 3.5–5.1)
Sodium: 142 mmol/L (ref 135–145)

## 2024-06-15 MED ORDER — ROSUVASTATIN CALCIUM 5 MG PO TABS
5.0000 mg | ORAL_TABLET | Freq: Every day | ORAL | Status: DC
  Administered 2024-06-15 – 2024-06-18 (×4): 5 mg via ORAL
  Filled 2024-06-15 (×4): qty 1

## 2024-06-15 MED ORDER — SODIUM CHLORIDE 0.9 % IV SOLN
500.0000 mg | INTRAVENOUS | Status: AC
  Administered 2024-06-15 – 2024-06-17 (×3): 500 mg via INTRAVENOUS
  Filled 2024-06-15 (×3): qty 5

## 2024-06-15 MED ORDER — MEMANTINE HCL 10 MG PO TABS
5.0000 mg | ORAL_TABLET | Freq: Every day | ORAL | Status: DC
  Administered 2024-06-15 – 2024-06-18 (×4): 5 mg via ORAL
  Filled 2024-06-15 (×4): qty 1

## 2024-06-15 MED ORDER — SODIUM CHLORIDE 0.9 % IV SOLN: INTRAVENOUS | Status: AC

## 2024-06-15 MED ORDER — LOSARTAN POTASSIUM 25 MG PO TABS
25.0000 mg | ORAL_TABLET | Freq: Every day | ORAL | Status: DC
Start: 2024-06-15 — End: 2024-06-18
  Administered 2024-06-15 – 2024-06-18 (×4): 25 mg via ORAL
  Filled 2024-06-15 (×4): qty 1

## 2024-06-15 MED ORDER — GUAIFENESIN 100 MG/5ML PO LIQD
15.0000 mL | ORAL | Status: DC | PRN
  Administered 2024-06-17 – 2024-06-18 (×2): 15 mL via ORAL
  Filled 2024-06-15 (×2): qty 20

## 2024-06-15 MED ORDER — DONEPEZIL HCL 5 MG PO TABS
10.0000 mg | ORAL_TABLET | Freq: Every day | ORAL | Status: DC
  Administered 2024-06-15 – 2024-06-17 (×3): 10 mg via ORAL
  Filled 2024-06-15 (×3): qty 2

## 2024-06-15 NOTE — Progress Notes (Signed)
 Occupational Therapy Treatment Patient Details Name: Darryl Roman MRN: [id number] DOB: Feb 12, 1933 Today's Date: 06/15/2024   History of present illness Pt is an 89 y/o M presenting to ED with c/o SOB, chest pain. Workup for new onset afib with RVR. PMH significant for HTN, CVA, dementia, HLD, GERD, COPD, prostate cancer.   OT comments  Darryl Roman was seen for OT treatment on this date. Upon arrival to room pt in bed, alert and significantly less combative than prior sate, family agreeable to tx. Pt requires MAX A x2 sup<>sit. MIN A x2 sit<>stand from elevated bed for 4 trials using RW and HHA - appears less agitated with HHA. Cognition limiting ability to take steps or engage in meaningful ADLs. Pt making progress toward goals, will continue to follow POC. Discharge recommendation remains appropriate.        If plan is discharge home, recommend the following:  Two people to help with walking and/or transfers;Two people to help with bathing/dressing/bathroom;Supervision due to cognitive status   Equipment Recommendations  BSC/3in1    Recommendations for Other Services      Precautions / Restrictions Precautions Precautions: Fall Recall of Precautions/Restrictions: Impaired Restrictions Weight Bearing Restrictions Per Provider Order: No       Mobility Bed Mobility Overal bed mobility: Needs Assistance Bed Mobility: Supine to Sit, Sit to Supine     Supine to sit: Max assist, +2 for physical assistance Sit to supine: Max assist, +2 for physical assistance        Transfers Overall transfer level: Needs assistance Equipment used: 2 person hand held assist, Rolling walker (2 wheels) Transfers: Sit to/from Stand Sit to Stand: Min assist, +2 physical assistance, From elevated surface                 Balance Overall balance assessment: Needs assistance Sitting-balance support: Bilateral upper extremity supported Sitting balance-Leahy Scale: Fair     Standing  balance support: Bilateral upper extremity supported, During functional activity, Reliant on assistive device for balance Standing balance-Leahy Scale: Poor                             ADL either performed or assessed with clinical judgement   ADL Overall ADL's : Needs assistance/impaired                                       General ADL Comments: MAX A don/doff B shoes bed level    Extremity/Trunk Assessment              Vision       Perception     Praxis     Communication Communication Communication: Impaired Factors Affecting Communication: Difficulty expressing self   Cognition Arousal: Alert Behavior During Therapy: Agitated, Impulsive Cognition: History of cognitive impairments, Cognition impaired             OT - Cognition Comments: holding objects that are not there                 Following commands: Impaired Following commands impaired: Follows one step commands inconsistently      Cueing   Cueing Techniques: Verbal cues, Tactile cues, Visual cues  Exercises      Shoulder Instructions       General Comments      Pertinent Vitals/ Pain       Pain Assessment  Pain Assessment: PAINAD Breathing: normal Negative Vocalization: none Facial Expression: smiling or inexpressive Body Language: tense, distressed pacing, fidgeting Consolability: distracted or reassured by voice/touch PAINAD Score: 2 Pain Intervention(s): Monitored during session  Home Living                                          Prior Functioning/Environment              Frequency  Min 2X/week        Progress Toward Goals  OT Goals(current goals can now be found in the care plan section)  Progress towards OT goals: Progressing toward goals  Acute Rehab OT Goals OT Goal Formulation: With family Time For Goal Achievement: 06/28/24 Potential to Achieve Goals: Fair ADL Goals Pt Will Perform Grooming: with min  assist;sitting Pt Will Perform Lower Body Dressing: with min assist;sitting/lateral leans;with caregiver independent in assisting Pt Will Transfer to Toilet: with min assist;stand pivot transfer;bedside commode  Plan      Co-evaluation    PT/OT/SLP Co-Evaluation/Treatment: Yes Reason for Co-Treatment: For patient/therapist safety;To address functional/ADL transfers PT goals addressed during session: Mobility/safety with mobility;Proper use of DME OT goals addressed during session: ADL's and self-care      AM-PAC OT 6 Clicks Daily Activity     Outcome Measure   Help from another person eating meals?: A Lot Help from another person taking care of personal grooming?: A Lot Help from another person toileting, which includes using toliet, bedpan, or urinal?: A Lot Help from another person bathing (including washing, rinsing, drying)?: A Lot Help from another person to put on and taking off regular upper body clothing?: A Lot Help from another person to put on and taking off regular lower body clothing?: A Lot 6 Click Score: 12    End of Session Equipment Utilized During Treatment: Gait belt;Rolling walker (2 wheels)  OT Visit Diagnosis: Other abnormalities of gait and mobility (R26.89);Muscle weakness (generalized) (M62.81)   Activity Tolerance Treatment limited secondary to agitation   Patient Left in bed;with bed alarm set;with call bell/phone within reach;with family/visitor present   Nurse Communication Mobility status        Time: 8577-8547 OT Time Calculation (min): 30 min  Charges: OT General Charges $OT Visit: 1 Visit OT Treatments $Self Care/Home Management : 8-22 mins  Darryl Roman, M.S. OTR/L  06/15/24, 4:15 PM  ascom [phone]

## 2024-06-15 NOTE — Progress Notes (Signed)
 Physical Therapy Treatment Patient Details Name: Darryl Roman MRN: [id number] DOB: July 25, 1933 Today's Date: 06/15/2024   History of Present Illness Pt is an 89 y/o M presenting to ED with c/o SOB, chest pain. Workup for new onset afib with RVR. PMH significant for HTN, CVA, dementia, HLD, GERD, COPD, prostate cancer.    PT Comments  Pt confused and at time impulsive and agitated during the session but ultimately did make functional progress towards goals this session.  Pt required near total assist with bed mobility tasks but once sitting at the EOB was able to maintain his sitting balance without assist.  Pt was able to practice sit to/from stand transfers from an elevated EOB using both a RW and +2 HHA and was able to stand with each method with +2 min A.  Once in standing pt was impulsive and was unable to follow commands for safe use of the RW but with HHA pt would reach out for the walker.  Pt was ultimately able to take several very small steps at the EOB with spontaneous movements and was able to take one step backwards with extensive multi-modal cuing to do so.  Pt is at a very high risk for falls and will benefit from continued PT services upon discharge to safely address deficits listed in patient problem list for decreased caregiver assistance and eventual return to PLOF.     If plan is discharge home, recommend the following: Two people to help with walking and/or transfers;Two people to help with bathing/dressing/bathroom;Direct supervision/assist for medications management;Assist for transportation;Supervision due to cognitive status   Can travel by private vehicle     No  Equipment Recommendations  Other (comment) (TBD at next venue of care)    Recommendations for Other Services       Precautions / Restrictions Precautions Precautions: Fall Recall of Precautions/Restrictions: Impaired Restrictions Weight Bearing Restrictions Per Provider Order: No     Mobility  Bed  Mobility Overal bed mobility: Needs Assistance Bed Mobility: Supine to Sit, Sit to Supine     Supine to sit: Max assist, +2 for physical assistance Sit to supine: Max assist, +2 for physical assistance   General bed mobility comments: Near total assist for BLE and trunk control with little to no initiation of proper movement patterns even with max multi-modal cues    Transfers Overall transfer level: Needs assistance Equipment used: 2 person hand held assist, Rolling walker (2 wheels) Transfers: Sit to/from Stand Sit to Stand: From elevated surface, +2 physical assistance, Min assist           General transfer comment: Pt able to perform multiple sit to/from stands with trials of +2 HHA and use of a RW; pt required max multi-modal cues for sequencing but ultimately only +2 min A to come to standing from an elevated EOB    Ambulation/Gait Ambulation/Gait assistance: Min assist, +2 physical assistance, +2 safety/equipment Gait Distance (Feet): 1 Feet Assistive device: Rolling walker (2 wheels), 2 person hand held assist Gait Pattern/deviations: Shuffle, Trunk flexed, Step-through pattern, Decreased step length - right, Decreased step length - left Gait velocity: decreased     General Gait Details: Pt impulsive in standing with poor safety awareness and unable to follow commands for proper use of the RW; pt was able to remain in static standing mostly without physical assist but required min A for stability and to guide the RW while taking small steps near the EOB   Stairs  Wheelchair Mobility     Tilt Bed    Modified Rankin (Stroke Patients Only)       Balance Overall balance assessment: Needs assistance   Sitting balance-Leahy Scale: Fair     Standing balance support: Bilateral upper extremity supported, During functional activity, Reliant on assistive device for balance Standing balance-Leahy Scale: Poor                               Communication Communication Communication: Impaired Factors Affecting Communication: Difficulty expressing self  Cognition Arousal: Alert Behavior During Therapy: Impulsive, Restless, Agitated   PT - Cognitive impairments: Sequencing, Problem solving, Safety/Judgement, Awareness, Orientation, Initiation                         Following commands: Impaired Following commands impaired: Follows one step commands inconsistently    Cueing Cueing Techniques: Verbal cues, Tactile cues, Visual cues  Exercises Other Exercises Other Exercises: Static sitting and standing at the EOB for improved stability and activity tolerance    General Comments        Pertinent Vitals/Pain Pain Assessment Pain Assessment: PAINAD Breathing: normal Negative Vocalization: occasional moan/groan, low speech, negative/disapproving quality Facial Expression: smiling or inexpressive Body Language: tense, distressed pacing, fidgeting Consolability: distracted or reassured by voice/touch PAINAD Score: 3 Pain Intervention(s): Monitored during session, Repositioned    Home Living                          Prior Function            PT Goals (current goals can now be found in the care plan section) Progress towards PT goals: Progressing toward goals    Frequency    Min 2X/week      PT Plan      Co-evaluation PT/OT/SLP Co-Evaluation/Treatment: Yes Reason for Co-Treatment: For patient/therapist safety;To address functional/ADL transfers PT goals addressed during session: Mobility/safety with mobility;Proper use of DME        AM-PAC PT 6 Clicks Mobility   Outcome Measure  Help needed turning from your back to your side while in a flat bed without using bedrails?: Total Help needed moving from lying on your back to sitting on the side of a flat bed without using bedrails?: Total Help needed moving to and from a bed to a chair (including a wheelchair)?: Total Help needed  standing up from a chair using your arms (e.g., wheelchair or bedside chair)?: A Lot Help needed to walk in hospital room?: Total Help needed climbing 3-5 steps with a railing? : Total 6 Click Score: 7    End of Session Equipment Utilized During Treatment: Gait belt Activity Tolerance: Treatment limited secondary to agitation Patient left: in bed;with call bell/phone within reach;with bed alarm set;with family/visitor present Nurse Communication: Mobility status PT Visit Diagnosis: Other abnormalities of gait and mobility (R26.89);Difficulty in walking, not elsewhere classified (R26.2)     Time: 8574-8548 PT Time Calculation (min) (ACUTE ONLY): 26 min  Charges:    $Therapeutic Activity: 8-22 mins PT General Charges $$ ACUTE PT VISIT: 1 Visit                     D. Glendia Bertin PT, DPT 06/15/24, 4:06 PM

## 2024-06-15 NOTE — Progress Notes (Addendum)
 Progress Note   Patient: Darryl Roman FMW:[id number] DOB: 1932-11-20 DOA: 06/12/2024     0 DOS: the patient was seen and examined on 06/15/2024    Brief hospital course: From HPI Darryl Roman is a 89 y.o. male with medical history significant for CVA, hypertension, hyperlipidemia, GERD, COPD, prostate cancer, alcohol use, dementia, and nephrolithiasis. The patient presents with 3 days of increased shortness of breath and weakness. His wife took his blood pressure this morning and found him to have a systolic pressure of 90 and a very high heart rate.     In the ED the patient was found to be in AF with RVR. HR was initially 150. He was given a diltiazem  bolus which brought his HR down to 125. He was then placed on a drip. He has been started on eliquis .    Creatinine was increased at 1.37. This is elevated over his baseline of 1.05. Troponin I is elevated at 42 and then 40.    CXR demonstrated COPD, and no acute changes.   EKG demonstrated atrial fibrillation with RVR.     Assessment and Plan:   New onset atrial fibrillation with RVR (HCC) Currently rate controlled I discussed Eliquis  with the patient's family and they have agreed Was on Cardizem  drip which has been switched to oral diltiazem  Continue to monitor closely   Acute delirium-improving Patient continues to remain confused but improving Continue delirium precaution  Left knee pain due to osteoarthritis X-ray of the knee shows findings of osteoarthritis I have discussed the case with Dr. Lorelle for possible steroid injection  Sepsis secondary to community-acquired pneumonia likely due to bacterial cause Patient with temperature 101, WBC 11.9, respiratory rate up to 35 and pulse of 103 Chest x-ray showing findings of left-sided infiltrate Patient will be continued on ceftriaxone  and azithromycin  Urinalysis did not show UTI  Hypertension Continue diltiazem    Dementia behavioral disturbance (HCC) According to  nursing staff patient noted to have some agitation this morning Continue daily aripiprazole  Geodon  discontinued We will avoid sedating medication   Prostate cancer (HCC) Noted.   Hyperlipidemia Continue Crestor  as at home.      Advance Care Planning:   Code Status: Full Code    Consults: None   Family Communication: Spouse at bedside   Disposition: PT OT recommends skilled nursing facility   Subjective:  Denies nausea vomiting abdominal pain chest pain cough Mental status improving Patient did have a temperature spike Chest x-ray showing findings concerning for pneumonia  Physical Exam:    Constitutional:  Elderly male laying in bed confused and disoriented Respiratory:  No increased work of breathing. No wheezes, rales, or rhonchi No tactile fremitus Cardiovascular:  Regular rate and rhythm No murmurs, ectopy, or gallups. No lateral PMI. No thrills. Abdomen:  Abdomen is soft, non-tender, non-distended No hernias, masses, or organomegaly Normoactive bowel sounds.  Musculoskeletal:  No cyanosis, clubbing, or edema Skin:  No rashes, lesions, ulcers palpation of skin: no induration or nodules Neurologic: Confused   Data Reviewed:      Latest Ref Rng & Units 06/15/2024    3:27 AM 06/14/2024    4:33 AM 06/13/2024    3:52 AM  CBC  WBC 4.0 - 10.5 K/uL 11.0  11.9  8.2   Hemoglobin 13.0 - 17.0 g/dL 85.1  83.9  85.5   Hematocrit 39.0 - 52.0 % 42.9  44.9  40.0   Platelets 150 - 400 K/uL 210  221  186  Latest Ref Rng & Units 06/15/2024    3:27 AM 06/14/2024    4:33 AM 06/13/2024    3:52 AM  BMP  Glucose 70 - 99 mg/dL 862  868  887   BUN 8 - 23 mg/dL 24  16  20    Creatinine 0.61 - 1.24 mg/dL 8.85  8.97  8.82   Sodium 135 - 145 mmol/L 142  143  144   Potassium 3.5 - 5.1 mmol/L 3.5  3.9  3.5   Chloride 98 - 111 mmol/L 112  111  112   CO2 22 - 32 mmol/L 21  22  21    Calcium  8.9 - 10.3 mg/dL 8.4  8.9  8.4       Vitals:   06/15/24 0727 06/15/24 1120  06/15/24 1521 06/15/24 1524  BP: (!) 143/66 137/70  (!) 150/89  Pulse: 81 85  95  Resp: (!) 23 (!) 26  18  Temp: 99.7 F (37.6 C) (!) 100.5 F (38.1 C) (!) 100.6 F (38.1 C)   TempSrc: Axillary Axillary Axillary   SpO2: 94% 95%  96%  Weight:      Height:         Author: Drue ONEIDA Potter, MD 06/15/2024 3:49 PM  For on call review [email or web address].

## 2024-06-15 NOTE — Plan of Care (Signed)

## 2024-06-15 NOTE — Telephone Encounter (Signed)
 Per Dr. Melanee he is admitted. we will reach out end of the week and psma can be done in the next 2 weeks sometime. see me 1 week after psma and he gets eligard on that day.  Follow up with Arland tomorrow 06/16/24 to assist with scheduling.

## 2024-06-16 ENCOUNTER — Encounter: Payer: Self-pay | Admitting: Oncology

## 2024-06-16 DIAGNOSIS — I4891 Unspecified atrial fibrillation: Secondary | ICD-10-CM | POA: Diagnosis not present

## 2024-06-16 LAB — CBC WITH DIFFERENTIAL/PLATELET
Abs Immature Granulocytes: 0.05 K/uL (ref 0.00–0.07)
Basophils Absolute: 0 K/uL (ref 0.0–0.1)
Basophils Relative: 0 %
Eosinophils Absolute: 0.1 K/uL (ref 0.0–0.5)
Eosinophils Relative: 1 %
HCT: 41.4 % (ref 39.0–52.0)
Hemoglobin: 14.3 g/dL (ref 13.0–17.0)
Immature Granulocytes: 1 %
Lymphocytes Relative: 12 %
Lymphs Abs: 1.1 K/uL (ref 0.7–4.0)
MCH: 32.3 pg (ref 26.0–34.0)
MCHC: 34.5 g/dL (ref 30.0–36.0)
MCV: 93.5 fL (ref 80.0–100.0)
Monocytes Absolute: 0.8 K/uL (ref 0.1–1.0)
Monocytes Relative: 9 %
Neutro Abs: 7.3 K/uL (ref 1.7–7.7)
Neutrophils Relative %: 77 %
Platelets: 202 K/uL (ref 150–400)
RBC: 4.43 MIL/uL (ref 4.22–5.81)
RDW: 12.4 % (ref 11.5–15.5)
WBC: 9.4 K/uL (ref 4.0–10.5)
nRBC: 0 % (ref 0.0–0.2)

## 2024-06-16 LAB — BASIC METABOLIC PANEL WITH GFR
Anion gap: 9 (ref 5–15)
BUN: 23 mg/dL (ref 8–23)
CO2: 21 mmol/L — ABNORMAL LOW (ref 22–32)
Calcium: 8.4 mg/dL — ABNORMAL LOW (ref 8.9–10.3)
Chloride: 112 mmol/L — ABNORMAL HIGH (ref 98–111)
Creatinine, Ser: 0.97 mg/dL (ref 0.61–1.24)
GFR, Estimated: >60 mL/min (ref >60)
Glucose, Bld: 139 mg/dL — ABNORMAL HIGH (ref 70–99)
Potassium: 3.5 mmol/L (ref 3.5–5.1)
Sodium: 142 mmol/L (ref 135–145)

## 2024-06-16 MED ORDER — TRAZODONE HCL 50 MG PO TABS
50.0000 mg | ORAL_TABLET | Freq: Every day | ORAL | Status: DC
  Administered 2024-06-16: 50 mg via ORAL
  Filled 2024-06-16: qty 1

## 2024-06-16 NOTE — Plan of Care (Signed)

## 2024-06-16 NOTE — Progress Notes (Addendum)
 Progress Note   Patient: Darryl Roman FMW:[id number] DOB: 30-May-1933 DOA: 06/12/2024     1 DOS: the patient was seen and examined on 06/16/2024   Brief hospital course: Darryl Roman is a 89 y.o. male with medical history significant for CVA, hypertension, hyperlipidemia, GERD, COPD, prostate cancer, alcohol use, dementia, and nephrolithiasis. The patient presents with 3 days of increased shortness of breath, cough and weakness.  In the ED the patient was found to be in AF with RVR. HR was initially 150.  Chest x-ray showed findings concerning for pneumonia.   Assessment and Plan:   Sepsis secondary to community-acquired pneumonia likely due to bacterial cause Patient with temperature 101, WBC 11.9, respiratory rate up to 35 and pulse of 103 Chest x-ray showing findings of left-sided infiltrate Continue ceftriaxone  and azithromycin  Urinalysis did not show UTI  New onset atrial fibrillation with RVR (HCC) Currently rate controlled I discussed Eliquis  with the patient's family and they have agreed Was on Cardizem  drip which has been switched to oral diltiazem  Continue to monitor closely   Acute delirium-improving Patient continues to remain confused but improving Continue delirium precaution   Left knee pain due to osteoarthritis X-ray of the knee shows findings of osteoarthritis I have discussed the case with Dr. Lorelle and given patient's acute illness they will follow-up as an outpatient for any steroid injection that may be indicated at that time    Hypertension Continue diltiazem    Dementia behavioral disturbance (HCC) Continue above management Continue daily aripiprazole    History of prostate cancer (HCC) No acute intervention at this time  Hyperlipidemia Continue Crestor  as at home.      Advance Care Planning:   Code Status: Full Code    Consults: None   Family Communication: Spouse at bedside   Disposition: PT OT recommends skilled nursing facility    Subjective:  Denies nausea vomiting abdominal pain chest pain cough Mental status better today than yesterday Temperature curve improving  Physical Exam: Constitutional:  Elderly male laying in bed confused however following commands Respiratory:  No increased work of breathing. No wheezes, rales, or rhonchi No tactile fremitus Cardiovascular:  Regular rate and rhythm No murmurs, ectopy, or gallups. No lateral PMI. No thrills. Abdomen:  Abdomen is soft, non-tender, non-distended No hernias, masses, or organomegaly Normoactive bowel sounds.  Musculoskeletal:  No cyanosis, clubbing, or edema Skin:  No rashes, lesions, ulcers palpation of skin: no induration or nodules Neurologic: Confused   Data Reviewed:   Vitals:   06/15/24 1524 06/15/24 2012 06/16/24 0759 06/16/24 1540  BP: (!) 150/89 (!) 148/78 (!) 143/91 (!) 135/113  Pulse: 95 84 89 80  Resp: 18 (!) 22    Temp:  99.4 F (37.4 C) 98.7 F (37.1 C) (!) 100.7 F (38.2 C)  TempSrc:   Oral   SpO2: 96% 97% 94% 94%  Weight:      Height:          Latest Ref Rng & Units 06/16/2024    7:38 AM 06/15/2024    3:27 AM 06/14/2024    4:33 AM  CBC  WBC 4.0 - 10.5 K/uL 9.4  11.0  11.9   Hemoglobin 13.0 - 17.0 g/dL 85.6  85.1  83.9   Hematocrit 39.0 - 52.0 % 41.4  42.9  44.9   Platelets 150 - 400 K/uL 202  210  221        Latest Ref Rng & Units 06/16/2024    7:38 AM 06/15/2024    3:27  AM 06/14/2024    4:33 AM  BMP  Glucose 70 - 99 mg/dL 860  862  868   BUN 8 - 23 mg/dL 23  24  16    Creatinine 0.61 - 1.24 mg/dL 9.02  8.85  8.97   Sodium 135 - 145 mmol/L 142  142  143   Potassium 3.5 - 5.1 mmol/L 3.5  3.5  3.9   Chloride 98 - 111 mmol/L 112  112  111   CO2 22 - 32 mmol/L 21  21  22    Calcium  8.9 - 10.3 mg/dL 8.4  8.4  8.9     Disposition: Skilled nursing facility as recommended by PT OT.  According to [facility] earliest discharge will be Thursday in order to meet 3 midnights.  Author: Drue ONEIDA Potter, MD 06/16/2024 3:56  PM  For on call review [email or web address].

## 2024-06-16 NOTE — NC FL2 (Signed)
 [place]  MEDICAID FL2 LEVEL OF CARE FORM     IDENTIFICATION  Patient Name: Darryl Roman Birthdate: [birth year] Sex: male Admission Date (Current Location): 06/12/2024  [facility] and [place] Number:  Chiropodist and Address:  [facility], [address] [place], [place] [address]      Provider Number: [id number]  Attending Physician Name and Address:  Dorinda Drue DASEN, MD  Relative Name and Phone Number:       Current Level of Care: Hospital Recommended Level of Care: Skilled Nursing Facility Prior Approval Number:    Date Approved/Denied:   PASRR Number: [id number]  Discharge Plan: SNF    Current Diagnoses: Patient Active Problem List   Diagnosis Date Noted   Atrial fibrillation with RVR (HCC) 06/12/2024   Dementia without behavioral disturbance (HCC) 06/12/2024   Hyperlipidemia 06/12/2024   Hypertension    Prostate cancer (HCC) 10/03/2022    Orientation RESPIRATION BLADDER Height & Weight      (Disoriented x 4)  Normal Incontinent Weight: 200 lb (90.7 kg) Height:  5' 9 (175.3 cm)  BEHAVIORAL SYMPTOMS/MOOD NEUROLOGICAL BOWEL NUTRITION STATUS   (Anxiety, some agitation.)  (Dementia) Continent Diet (Heart healthy/carb modified)  AMBULATORY STATUS COMMUNICATION OF NEEDS Skin   Extensive Assist Verbally Normal                       Personal Care Assistance Level of Assistance  Bathing, Feeding, Dressing Bathing Assistance: Maximum assistance Feeding assistance: Limited assistance Dressing Assistance: Maximum assistance     Functional Limitations Info  Sight, Hearing, Speech Sight Info: Adequate Hearing Info: Adequate Speech Info: Adequate    SPECIAL CARE FACTORS FREQUENCY  PT (By licensed PT), OT (By licensed OT)     PT Frequency: 5 x week OT Frequency: 5 x week            Contractures Contractures Info: Not present    Additional Factors Info  Code Status, Allergies Code Status Info: Full  code Allergies Info: Sulfa Antibiotics           Current Medications (06/16/2024):  This is the current hospital active medication list Current Facility-Administered Medications  Medication Dose Route Frequency Provider Last Rate Last Admin   acetaminophen  (TYLENOL ) tablet 650 mg  650 mg Oral Q6H PRN Swayze, Ava, DO   650 mg at 06/15/24 1545   Or   acetaminophen  (TYLENOL ) suppository 650 mg  650 mg Rectal Q6H PRN Swayze, Ava, DO   650 mg at 06/14/24 1725   apixaban  (ELIQUIS ) tablet 5 mg  5 mg Oral BID Swayze, Ava, DO   5 mg at 06/16/24 1151   ARIPiprazole  (ABILIFY ) tablet 10 mg  10 mg Oral Daily Djan, Prince T, MD   10 mg at 06/16/24 1152   azithromycin  (ZITHROMAX ) 500 mg in sodium chloride  0.9 % 250 mL IVPB  500 mg Intravenous Q24H Dorinda Drue T, MD 250 mL/hr at 06/16/24 1150 500 mg at 06/16/24 1150   cefTRIAXone  (ROCEPHIN ) 1 g in sodium chloride  0.9 % 100 mL IVPB  1 g Intravenous Q24H Dorinda Drue T, MD 200 mL/hr at 06/15/24 1704 1 g at 06/15/24 1704   diltiazem  (CARDIZEM  CD) 24 hr capsule 120 mg  120 mg Oral Daily Djan, Prince T, MD   120 mg at 06/16/24 1151   donepezil  (ARICEPT ) tablet 10 mg  10 mg Oral QHS Djan, Prince T, MD   10 mg at 06/15/24 2147   guaiFENesin  (ROBITUSSIN) 100 MG/5ML liquid  15 mL  15 mL Oral Q4H PRN Dorinda Drue DASEN, MD       lidocaine  (LIDODERM ) 5 % 1 patch  1 patch Transdermal Q24H Djan, Prince T, MD   1 patch at 06/15/24 1703   losartan  (COZAAR ) tablet 25 mg  25 mg Oral Daily Djan, Prince T, MD   25 mg at 06/16/24 1151   memantine  (NAMENDA ) tablet 5 mg  5 mg Oral Daily Djan, Prince T, MD   5 mg at 06/16/24 1151   mirtazapine  (REMERON ) tablet 7.5 mg  7.5 mg Oral QHS PRN Duncan, Hazel V, MD   7.5 mg at 06/15/24 2146   polyethylene glycol (MIRALAX  / GLYCOLAX ) packet 17 g  17 g Oral Daily PRN Swayze, Ava, DO       rosuvastatin  (CRESTOR ) tablet 5 mg  5 mg Oral Daily Djan, Prince T, MD   5 mg at 06/16/24 1151   traZODone  (DESYREL ) tablet 50 mg  50 mg Oral QHS Dorinda Drue DASEN, MD         Discharge Medications: Please see discharge summary for a list of discharge medications.  Relevant Imaging Results:  Relevant Lab Results:   Additional Information SS#: [id number]. Switched to inpatient on 9/15.  Lauraine JAYSON Carpen, LCSW

## 2024-06-16 NOTE — Progress Notes (Signed)
 Patients family at bedside stated to me this morning they would like to speak to case manager about getting patient to nursing home after discharge today. Uneventful night for patient, patient family stated patient did not sleep throughout the night.

## 2024-06-16 NOTE — Progress Notes (Addendum)
 Occupational Therapy Treatment Patient Details Name: Darryl Roman MRN: [id number] DOB: [birth year] Today's Date: 06/16/2024   History of present illness Pt is an 89 y/o M presenting to ED with c/o SOB, chest pain. Workup for new onset afib with RVR. PMH significant for HTN, CVA, dementia, HLD, GERD, COPD, prostate cancer.   OT comments  Darryl Roman was seen for OT treatment on this date. Upon arrival to room pt in bed with family in room, agreeable to tx. Improved alertness and command following this date. Pt requires MOD A exit bed. MOD A x2 + HHA bed>chair t/f ~5 ft. Pt making good progress toward goals, will continue to follow POC. Discharge recommendation remains appropriate.       If plan is discharge home, recommend the following:  Two people to help with walking and/or transfers;Two people to help with bathing/dressing/bathroom;Supervision due to cognitive status   Equipment Recommendations  BSC/3in1    Recommendations for Other Services      Precautions / Restrictions Precautions Precautions: Fall Recall of Precautions/Restrictions: Impaired Restrictions Weight Bearing Restrictions Per Provider Order: No       Mobility Bed Mobility Overal bed mobility: Needs Assistance Bed Mobility: Supine to Sit     Supine to sit: Mod assist          Transfers Overall transfer level: Needs assistance Equipment used: 2 person hand held assist Transfers: Sit to/from Stand, Bed to chair/wheelchair/BSC Sit to Stand: Min assist, +2 physical assistance     Step pivot transfers: Mod assist, +2 physical assistance           Balance Overall balance assessment: Needs assistance Sitting-balance support: Bilateral upper extremity supported Sitting balance-Leahy Scale: Fair     Standing balance support: Bilateral upper extremity supported, During functional activity, Reliant on assistive device for balance Standing balance-Leahy Scale: Poor                              ADL either performed or assessed with clinical judgement   ADL Overall ADL's : Needs assistance/impaired                                       General ADL Comments: MOD A x2 + HHA simulated BSC t/f     Communication Communication Communication: Impaired Factors Affecting Communication: Difficulty expressing self   Cognition Arousal: Alert Behavior During Therapy: Impulsive Cognition: History of cognitive impairments, Cognition impaired Difficult to assess due to: Impaired communication, Level of arousal           OT - Cognition Comments: constant redirection                 Following commands: Impaired Following commands impaired: Follows one step commands inconsistently      Cueing   Cueing Techniques: Verbal cues, Tactile cues, Visual cues  Exercises      Shoulder Instructions       General Comments      Pertinent Vitals/ Pain       Pain Assessment Pain Assessment: PAINAD Breathing: normal Negative Vocalization: none Facial Expression: smiling or inexpressive Body Language: tense, distressed pacing, fidgeting Consolability: no need to console PAINAD Score: 1 Pain Intervention(s): Limited activity within patient's tolerance   Frequency  Min 2X/week        Progress Toward Goals  OT Goals(current goals can now be found in  the care plan section)  Progress towards OT goals: Progressing toward goals  Acute Rehab OT Goals OT Goal Formulation: With family Time For Goal Achievement: 06/28/24 Potential to Achieve Goals: Fair ADL Goals Pt Will Perform Grooming: with min assist;sitting Pt Will Perform Lower Body Dressing: with min assist;sitting/lateral leans;with caregiver independent in assisting Pt Will Transfer to Toilet: with min assist;stand pivot transfer;bedside commode  Plan      Co-evaluation                 AM-PAC OT 6 Clicks Daily Activity     Outcome Measure   Help from another person eating  meals?: A Lot Help from another person taking care of personal grooming?: A Lot Help from another person toileting, which includes using toliet, bedpan, or urinal?: A Lot Help from another person bathing (including washing, rinsing, drying)?: A Lot Help from another person to put on and taking off regular upper body clothing?: A Lot Help from another person to put on and taking off regular lower body clothing?: A Lot 6 Click Score: 12    End of Session Equipment Utilized During Treatment: Gait belt  OT Visit Diagnosis: Other abnormalities of gait and mobility (R26.89);Muscle weakness (generalized) (M62.81)   Activity Tolerance Patient tolerated treatment well   Patient Left in chair;with call bell/phone within reach;with family/visitor present   Nurse Communication Mobility status        Time: 8547-8492 OT Time Calculation (min): 15 min  Charges: OT General Charges $OT Visit: 1 Visit OT Treatments $Therapeutic Activity: 8-22 mins  Darryl Roman, M.S. OTR/L  06/16/24, 3:22 PM  ascom [phone]

## 2024-06-16 NOTE — Telephone Encounter (Signed)
 Per [facility] PET PSMA for 06/24/24.

## 2024-06-16 NOTE — TOC Initial Note (Signed)
 Transition of Care [facility]) - Initial/Assessment Note    Patient Details  Name: Darryl Roman MRN: [id number] Date of Birth: [birth year]  [facility] CM/SW Contact:    Lauraine JAYSON Carpen, LCSW Phone Number: 06/16/2024, 2:19 PM  Clinical Narrative:   Patient disoriented x 4. Wife and son at bedside. CSW introduced role and explained that therapy recommendations would be discussed. Family is agreeable to SNF placement. First preference is [facility], second preference is [facility]. [facility] likely will not have a bed until Friday or Monday. Patient was switched to inpatient status yesterday. CSW notified wife and son that he will need a 3-night stay under inpatient status for insurance to cover rehab. No further concerns. CSW will continue to follow patient and his family for support and facilitate discharge to SNF once medically stable.  Expected Discharge Plan: Skilled Nursing Facility Barriers to Discharge: Continued Medical Work up   Patient Goals and CMS Choice            Expected Discharge Plan and Services     Post Acute Care Choice: Skilled Nursing Facility Living arrangements for the past 2 months: Single Family Home                                      Prior Living Arrangements/Services Living arrangements for the past 2 months: Single Family Home Lives with:: Spouse Patient language and need for interpreter reviewed:: Yes        Need for Family Participation in Patient Care: Yes (Comment) Care giver support system in place?: Yes (comment)   Criminal Activity/Legal Involvement Pertinent to Current Situation/Hospitalization: No - Comment as needed  Activities of Daily Living   ADL Screening (condition at time of admission) Independently performs ADLs?: No Is the patient deaf or have difficulty hearing?: No Does the patient have difficulty seeing, even when wearing glasses/contacts?: No Does the patient have difficulty  concentrating, remembering, or making decisions?: Yes  Permission Sought/Granted Permission sought to share information with : Facility Medical sales representative, Family Supports    Share Information with NAME: Jaxzen Vanhorn  Permission granted to share info w AGENCY: SNF's  Permission granted to share info w Relationship: Wife  Permission granted to share info w Contact Information: [phone]  Emotional Assessment Appearance:: Appears stated age Attitude/Demeanor/Rapport: Unable to Assess Affect (typically observed): Unable to Assess Orientation: :  (Disoriented x 4) Alcohol / Substance Use: Not Applicable Psych Involvement: No (comment)  Admission diagnosis:  New onset atrial fibrillation (HCC) [I48.91] Atrial fibrillation with rapid ventricular response (HCC) [I48.91] Atrial fibrillation with RVR (HCC) [I48.91] Patient Active Problem List   Diagnosis Date Noted   Atrial fibrillation with RVR (HCC) 06/12/2024   Dementia without behavioral disturbance (HCC) 06/12/2024   Hyperlipidemia 06/12/2024   Hypertension    Prostate cancer (HCC) 10/03/2022   PCP:  Rudolpho Norleen BIRCH, MD Pharmacy:   [id number], [place] - [address] AT [facility] OF [address] [facility] [place] [address] Phone: [phone] Fax: [phone]  [facility] #[id number] [place], [place] - [address] AT [facility] OF [address] [place] [address] Phone: [phone] Fax: [phone]     Social Drivers of Health (SDOH) Social History: SDOH Screenings   Food Insecurity: No Food Insecurity (06/14/2024)  Housing: Low Risk  (06/14/2024)  Transportation Needs: No Transportation Needs (06/14/2024)  Utilities: Not At Risk (06/14/2024)  Depression (PHQ2-9): Low Risk  (06/10/2024)  Financial Resource Strain: Low Risk  (02/05/2024)   Received from [facility]  Tobacco Use: Low Risk  (06/12/2024)   SDOH  Interventions:     Readmission Risk Interventions     No data to display

## 2024-06-16 NOTE — Progress Notes (Signed)
 Occupational Therapy Treatment Patient Details Name: Darryl Roman MRN: [id number] DOB: [birth year] Today's Date: 06/16/2024   History of present illness Pt is an 89 y/o M presenting to ED with c/o SOB, chest pain. Workup for new onset afib with RVR. PMH significant for HTN, CVA, dementia, HLD, GERD, COPD, prostate cancer.   OT comments  Mr Vanpatten was seen for OT treatment on this date per nursing request to assist with return to bed. Clean linen change provided, pt tolerated 1 hour sitting in chair. Pt requires MOD A x2 + HHA for ADL t/f ~10 ft. Good static sitting balance with restlessness noted - pulling at gown/lines. No combativeness/agitation noted this session. Family educated on safe t/f technique, delirium pcns, and d/c recs. Left in bed in chair position with family at bedside to promote wakefulness. Pt making good progress toward goals, will continue to follow POC. Discharge recommendation remains appropriate.       If plan is discharge home, recommend the following:  Two people to help with walking and/or transfers;Two people to help with bathing/dressing/bathroom;Supervision due to cognitive status   Equipment Recommendations  BSC/3in1    Recommendations for Other Services      Precautions / Restrictions Precautions Precautions: Fall Recall of Precautions/Restrictions: Impaired Restrictions Weight Bearing Restrictions Per Provider Order: No       Mobility Bed Mobility Overal bed mobility: Needs Assistance Bed Mobility: Sit to Supine     Supine to sit: Max assist, Mod assist Sit to supine: Max assist        Transfers Overall transfer level: Needs assistance Equipment used: 2 person hand held assist Transfers: Sit to/from Stand, Bed to chair/wheelchair/BSC Sit to Stand: Min assist, +2 physical assistance     Step pivot transfers: Mod assist, +2 physical assistance           Balance Overall balance assessment: Needs assistance Sitting-balance support:  Bilateral upper extremity supported Sitting balance-Leahy Scale: Fair     Standing balance support: Bilateral upper extremity supported, During functional activity, Reliant on assistive device for balance Standing balance-Leahy Scale: Poor                             ADL either performed or assessed with clinical judgement   ADL Overall ADL's : Needs assistance/impaired                                       General ADL Comments: MOD A x2 + HHA for ADL t/f ~10 ft.     Praxis     Communication Communication Communication: Impaired Factors Affecting Communication: Difficulty expressing self   Cognition Arousal: Alert Behavior During Therapy: Impulsive Cognition: History of cognitive impairments, Cognition impaired Difficult to assess due to: Impaired communication, Level of arousal           OT - Cognition Comments: constant redirection                 Following commands: Impaired Following commands impaired: Follows one step commands inconsistently      Cueing   Cueing Techniques: Verbal cues, Tactile cues, Visual cues  Exercises      Shoulder Instructions       General Comments      Pertinent Vitals/ Pain       Pain Assessment Pain Assessment: PAINAD Breathing: normal Negative Vocalization: none Facial Expression: smiling  or inexpressive Body Language: tense, distressed pacing, fidgeting Consolability: no need to console PAINAD Score: 1 Pain Intervention(s): Limited activity within patient's tolerance   Frequency  Min 2X/week        Progress Toward Goals  OT Goals(current goals can now be found in the care plan section)  Progress towards OT goals: Progressing toward goals  Acute Rehab OT Goals OT Goal Formulation: With family Time For Goal Achievement: 06/28/24 Potential to Achieve Goals: Fair ADL Goals Pt Will Perform Grooming: with min assist;sitting Pt Will Perform Lower Body Dressing: with min  assist;sitting/lateral leans;with caregiver independent in assisting Pt Will Transfer to Toilet: with min assist;stand pivot transfer;bedside commode  Plan      Co-evaluation                 AM-PAC OT 6 Clicks Daily Activity     Outcome Measure   Help from another person eating meals?: A Little Help from another person taking care of personal grooming?: A Lot Help from another person toileting, which includes using toliet, bedpan, or urinal?: A Lot Help from another person bathing (including washing, rinsing, drying)?: A Lot Help from another person to put on and taking off regular upper body clothing?: A Little Help from another person to put on and taking off regular lower body clothing?: A Lot 6 Click Score: 14    End of Session Equipment Utilized During Treatment: Gait belt  OT Visit Diagnosis: Other abnormalities of gait and mobility (R26.89);Muscle weakness (generalized) (M62.81)   Activity Tolerance Patient tolerated treatment well   Patient Left with call bell/phone within reach;with family/visitor present;in bed   Nurse Communication Mobility status        Time: 8447-8385 OT Time Calculation (min): 22 min  Charges: OT General Charges $OT Visit: 1 Visit OT Treatments $Self Care/Home Management : 8-22 mins $Therapeutic Activity: 8-22 mins  Elston Slot, M.S. OTR/L  06/16/24, 4:16 PM  ascom [phone]

## 2024-06-17 DIAGNOSIS — I4891 Unspecified atrial fibrillation: Secondary | ICD-10-CM | POA: Diagnosis not present

## 2024-06-17 LAB — CBC WITH DIFFERENTIAL/PLATELET
Abs Immature Granulocytes: 0.02 K/uL (ref 0.00–0.07)
Basophils Absolute: 0 K/uL (ref 0.0–0.1)
Basophils Relative: 1 %
Eosinophils Absolute: 0.2 K/uL (ref 0.0–0.5)
Eosinophils Relative: 3 %
HCT: 35.2 % — ABNORMAL LOW (ref 39.0–52.0)
Hemoglobin: 12.5 g/dL — ABNORMAL LOW (ref 13.0–17.0)
Immature Granulocytes: 0 %
Lymphocytes Relative: 19 %
Lymphs Abs: 1.3 K/uL (ref 0.7–4.0)
MCH: 33.1 pg (ref 26.0–34.0)
MCHC: 35.5 g/dL (ref 30.0–36.0)
MCV: 93.1 fL (ref 80.0–100.0)
Monocytes Absolute: 0.6 K/uL (ref 0.1–1.0)
Monocytes Relative: 10 %
Neutro Abs: 4.5 K/uL (ref 1.7–7.7)
Neutrophils Relative %: 67 %
Platelets: 199 K/uL (ref 150–400)
RBC: 3.78 MIL/uL — ABNORMAL LOW (ref 4.22–5.81)
RDW: 12.8 % (ref 11.5–15.5)
WBC: 6.6 K/uL (ref 4.0–10.5)
nRBC: 0 % (ref 0.0–0.2)

## 2024-06-17 LAB — BASIC METABOLIC PANEL WITH GFR
Anion gap: 7 (ref 5–15)
BUN: 26 mg/dL — ABNORMAL HIGH (ref 8–23)
CO2: 22 mmol/L (ref 22–32)
Calcium: 8.2 mg/dL — ABNORMAL LOW (ref 8.9–10.3)
Chloride: 113 mmol/L — ABNORMAL HIGH (ref 98–111)
Creatinine, Ser: 0.87 mg/dL (ref 0.61–1.24)
GFR, Estimated: >60 mL/min (ref >60)
Glucose, Bld: 119 mg/dL — ABNORMAL HIGH (ref 70–99)
Potassium: 3.3 mmol/L — ABNORMAL LOW (ref 3.5–5.1)
Sodium: 142 mmol/L (ref 135–145)

## 2024-06-17 MED ORDER — TRAZODONE HCL 50 MG PO TABS
50.0000 mg | ORAL_TABLET | Freq: Every evening | ORAL | Status: DC | PRN
  Administered 2024-06-17: 50 mg via ORAL
  Filled 2024-06-17: qty 1

## 2024-06-17 MED ORDER — ASPIRIN 325 MG PO TBEC: 325.0000 mg | DELAYED_RELEASE_TABLET | Freq: Every day | ORAL | Status: DC

## 2024-06-17 MED ORDER — POTASSIUM CHLORIDE CRYS ER 20 MEQ PO TBCR: 20.0000 meq | EXTENDED_RELEASE_TABLET | Freq: Once | ORAL | Status: DC

## 2024-06-17 MED ORDER — POTASSIUM CHLORIDE CRYS ER 20 MEQ PO TBCR
40.0000 meq | EXTENDED_RELEASE_TABLET | Freq: Once | ORAL | Status: AC
  Administered 2024-06-17: 40 meq via ORAL
  Filled 2024-06-17: qty 2

## 2024-06-17 NOTE — Plan of Care (Signed)

## 2024-06-17 NOTE — Progress Notes (Signed)
 [facility]  - [place] [facility]   PATIENT NAME: Darryl Roman    MR#:  [id number]  DATE OF BIRTH:  Mar 24, 1933  SUBJECTIVE:  met two sons at bedside earlier today. Patient was restless last night received trazodone  and during my evaluation was fast asleep. Has been tolerating PO diet according to the sun. On and off has issues with confusion due to dementia. No fever today. Being treated for community acquired pneumonia.    VITALS:  Blood pressure (!) 148/82, pulse 70, temperature 98.5 F (36.9 C), resp. rate (!) 25, height 5' 9 (1.753 m), weight 90.7 kg, SpO2 99%.  PHYSICAL EXAMINATION:  limited GENERAL:  89 y.o.-year-old patient with no acute distress.  LUNGS: Normal breath sounds bilaterally, no wheezing CARDIOVASCULAR: S1, S2 normal. No murmur   ABDOMEN: Soft, nontender, nondistended. Bowel sounds present.  EXTREMITIES: No  edema b/l.    NEUROLOGIC: nonfocal  sleeping moved spontaneously extremities   LABORATORY PANEL:  CBC Recent Labs  Lab 06/17/24 0358  WBC 6.6  HGB 12.5*  HCT 35.2*  PLT 199    Chemistries  Recent Labs  Lab 06/13/24 0352 06/14/24 0433 06/17/24 0358  NA 144   < > 142  K 3.5   < > 3.3*  CL 112*   < > 113*  CO2 21*   < > 22  GLUCOSE 112*   < > 119*  BUN 20   < > 26*  CREATININE 1.17   < > 0.87  CALCIUM  8.4*   < > 8.2*  AST 20  --   --   ALT 19  --   --   ALKPHOS 85  --   --   BILITOT 0.9  --   --    < > = values in this interval not displayed.   Assessment and Plan  Darryl Roman with medical history significant for CVA, hypertension, hyperlipidemia, GERD, COPD, prostate cancer, alcohol use, dementia, and nephrolithiasis. The patient presents with 3 days of increased shortness of breath, cough and weakness.  In the ED the patient was found to be in AF with RVR. HR was initially 150.  Chest x-ray showed findings concerning for pneumonia.     Sepsis secondary to community-acquired pneumonia  likely due to bacterial cause --Patient with temperature 101, WBC 11.9, respiratory rate up to 35 and pulse of 103 --Chest x-ray showing findings of left-sided infiltrate --Continue ceftriaxone  and azithromycin  --Urinalysis did not show UTI --now afebrile today   New onset atrial fibrillation with RVR (HCC) --Currently rate controlled --Dr Dorinda discussed Eliquis  with the patient's family and they have agreed --Was on Cardizem  drip which has been switched to oral diltiazem  --Continue to monitor closely--HR much better   Acute delirium-improving H/o dementia --Patient continues to remain confused but improving --Continue delirium precaution   Left knee pain due to osteoarthritis --X-ray of the knee shows findings of osteoarthritis --Dr Dorinda has discussed the case with Dr. Lorelle and given patient's acute illness they will follow-up as an outpatient for any steroid injection that may be indicated at that time   Hypertension --Continue diltiazem    Dementia behavioral disturbance (HCC) --Continue daily aripiprazole    History of non-metastatic prostate cancer (HCC) --No acute intervention at this time --Recent PSA elevated--pt to f/u with Dr Melanee ([facility])   Hyperlipidemia --Continue Crestor  as at home.      Procedures: Family communication :sons at bedside Consults :none CODE STATUS: full DVT Prophylaxis :apixaban   Level of care: Progressive Status is: Inpatient Remains inpatient appropriate because: medically best at baseline. Awaiting insurance auth for d/c to rehab ([facility])    TOTAL TIME TAKING CARE OF THIS PATIENT: 40 minutes.  >50% time spent on counselling and coordination of care  Note: This dictation was prepared with Dragon dictation along with smaller phrase technology. Any transcriptional errors that result from this process are unintentional.  Leita Blanch M.D    [facility]   CC: Primary care physician; Rudolpho Norleen BIRCH, MD

## 2024-06-17 NOTE — Progress Notes (Signed)
 PT Cancellation Note  Patient Details Name: Darryl Roman MRN: [id number] DOB: [birth year]   Cancelled Treatment:     PT attempt. Pt sleeping soundly. Family members at bedside requested author let pt sleep. Will return this afternoon/after lunch as requested.    Rankin KATHEE Essex 06/17/2024, 9:56 AM

## 2024-06-17 NOTE — Progress Notes (Signed)
 Occupational Therapy Treatment Patient Details Name: Darryl Roman MRN: [id number] DOB: [birth year] Today's Date: 06/17/2024   History of present illness Pt is an 89 y/o M presenting to ED with c/o SOB, chest pain. Workup for new onset afib with RVR. PMH significant for HTN, CVA, dementia, HLD, GERD, COPD, prostate cancer.   OT comments  Darryl Roman was seen for OT treatment on this date. Upon arrival to room pt seated in chair, agreeable to tx. Pt requires MOD A x2 + RW for ADL t/f ~10 ft. MAX A for LB access in sitting. MAX A sit>sup. Improved participation, continues to require redirection. Pt making progress toward goals, will continue to follow POC. Discharge recommendation remains appropriate.        If plan is discharge home, recommend the following:  Two people to help with walking and/or transfers;Two people to help with bathing/dressing/bathroom;Supervision due to cognitive status   Equipment Recommendations  BSC/3in1    Recommendations for Other Services      Precautions / Restrictions Precautions Precautions: Fall Recall of Precautions/Restrictions: Impaired Restrictions Weight Bearing Restrictions Per Provider Order: No       Mobility Bed Mobility Overal bed mobility: Needs Assistance Bed Mobility: Sit to Supine       Sit to supine: Max assist        Transfers Overall transfer level: Needs assistance Equipment used: Rolling walker (2 wheels) Transfers: Sit to/from Stand Sit to Stand: Min assist, +2 safety/equipment                 Balance Overall balance assessment: Needs assistance Sitting-balance support: Bilateral upper extremity supported Sitting balance-Leahy Scale: Good     Standing balance support: Bilateral upper extremity supported, During functional activity, Reliant on assistive device for balance Standing balance-Leahy Scale: Poor                             ADL either performed or assessed with clinical judgement    ADL Overall ADL's : Needs assistance/impaired                                       General ADL Comments: MOD A x2 + RW for ADL t/f ~10 ft.     Communication Communication Communication: No apparent difficulties Factors Affecting Communication: Difficulty expressing self   Cognition Arousal: Alert Behavior During Therapy: Flat affect Cognition: History of cognitive impairments, Cognition impaired             OT - Cognition Comments: redirection to task                 Following commands: Impaired Following commands impaired: Follows one step commands inconsistently      Cueing   Cueing Techniques: Verbal cues, Tactile cues, Visual cues  Exercises      Shoulder Instructions       General Comments      Pertinent Vitals/ Pain       Pain Assessment Pain Assessment: No/denies pain   Frequency  Min 2X/week        Progress Toward Goals  OT Goals(current goals can now be found in the care plan section)  Progress towards OT goals: Progressing toward goals  Acute Rehab OT Goals OT Goal Formulation: With family Time For Goal Achievement: 06/28/24 Potential to Achieve Goals: Fair ADL Goals Pt Will Perform Grooming: with min  assist;sitting Pt Will Perform Lower Body Dressing: with min assist;sitting/lateral leans;with caregiver independent in assisting Pt Will Transfer to Toilet: with min assist;stand pivot transfer;bedside commode  Plan      Co-evaluation        PT goals addressed during session: Mobility/safety with mobility;Balance;Proper use of DME;Strengthening/ROM        AM-PAC OT 6 Clicks Daily Activity     Outcome Measure   Help from another person eating meals?: A Little Help from another person taking care of personal grooming?: A Lot Help from another person toileting, which includes using toliet, bedpan, or urinal?: A Lot Help from another person bathing (including washing, rinsing, drying)?: A Lot Help from  another person to put on and taking off regular upper body clothing?: A Little Help from another person to put on and taking off regular lower body clothing?: A Lot 6 Click Score: 14    End of Session Equipment Utilized During Treatment: Rolling walker (2 wheels)  OT Visit Diagnosis: Other abnormalities of gait and mobility (R26.89);Muscle weakness (generalized) (M62.81)   Activity Tolerance Patient tolerated treatment well   Patient Left in bed;with call bell/phone within reach;with bed alarm set;with nursing/sitter in room;with family/visitor present   Nurse Communication Mobility status        Time: 8394-8384 OT Time Calculation (min): 10 min  Charges: OT General Charges $OT Visit: 1 Visit OT Treatments $Therapeutic Activity: 8-22 mins  Elston Slot, M.S. OTR/L  06/17/24, 4:23 PM  ascom [phone]

## 2024-06-17 NOTE — Care Management Important Message (Signed)
 Important Message  Patient Details  Name: Darryl Roman MRN: [id number] Date of Birth: January 11, 1933   Important Message Given:  Yes - Medicare IM     [name] 06/17/2024, 1:10 PM

## 2024-06-17 NOTE — Progress Notes (Signed)
 Physical Therapy Treatment Patient Details Name: Darryl Roman MRN: [id number] DOB: 27-May-1933 Today's Date: 06/17/2024   History of Present Illness Pt is an 89 y/o M presenting to ED with c/o SOB, chest pain. Workup for new onset afib with RVR. PMH significant for HTN, CVA, dementia, HLD, GERD, COPD, prostate cancer.    PT Comments  Pt was long sitting in bed with supportive spouse, soon and daughter in law present. Pt was alert but presents with flat affect. Pt follows simple commands more often not but not totally consistent. Pt was cooperative and did perform all desired task requested. +2 assistance for safety. He was able to exit R side of bed, stand, and ambulate  4 x ~ 12' with chair follow for safety. Pt seems to have poor awareness of his deficits and overall abilities. He remains not at his baseline abilities and will benefit form continued skilled PT at DC to maximize independence and safety with all ADLs.    If plan is discharge home, recommend the following: Two people to help with walking and/or transfers;Two people to help with bathing/dressing/bathroom;Direct supervision/assist for medications management;Assist for transportation;Supervision due to cognitive status     Equipment Recommendations  Other (comment)       Precautions / Restrictions Precautions Precautions: Fall Recall of Precautions/Restrictions: Impaired Restrictions Weight Bearing Restrictions Per Provider Order: No     Mobility  Bed Mobility Overal bed mobility: Needs Assistance Bed Mobility: Supine to Sit  Supine to sit: Min assist, Mod assist, HOB elevated  General bed mobility comments: pt required increased time + min-mod assist of one to exit R side of bed. tcs for sequencing    Transfers Overall transfer level: Needs assistance Equipment used: Rolling walker (2 wheels) Transfers: Sit to/from Stand Sit to Stand: Min assist, +2 safety/equipment, Mod assist  General transfer comment: Pt stood 2  x EOB and 3 x from recliner with min-mod assist of one with 2nd person for safety.    Ambulation/Gait Ambulation/Gait assistance: Min assist, Mod assist, +2 safety/equipment Gait Distance (Feet): 12 Feet Assistive device: Rolling walker (2 wheels) Gait Pattern/deviations: Step-to pattern, Decreased step length - left, Decreased step length - right Gait velocity: decreased  General Gait Details: Pt ambulated 3 x 12 ft and 1 x 8 ft with recliner follow for safety. Gaitr belt also used for additional safety. Overall pt tolerated ambulation well but remains far from his baseline abilities.    Balance Overall balance assessment: Needs assistance Sitting-balance support: Bilateral upper extremity supported Sitting balance-Leahy Scale: Good     Standing balance support: Bilateral upper extremity supported, During functional activity, Reliant on assistive device for balance Standing balance-Leahy Scale: Poor Standing balance comment: pt remains at high risk of falls       Communication Communication Communication: No apparent difficulties  Cognition Arousal: Alert Behavior During Therapy: Flat affect   PT - Cognitive impairments: Awareness, Problem solving, Safety/Judgement, History of cognitive impairments    PT - Cognition Comments: Pt is alert throughout session however presents with flat affect. He is able to follow simple commands inconsistently with tcs/vcs and increased time. Author questions if hearing impairments plays large role in cognition presentation Following commands: Impaired Following commands impaired: Follows one step commands inconsistently    Cueing Cueing Techniques: Verbal cues, Tactile cues, Visual cues         Pertinent Vitals/Pain Pain Assessment Pain Assessment: No/denies pain Breathing: normal     PT Goals (current goals can now be found in the  care plan section) Acute Rehab PT Goals Patient Stated Goal: none stated by pt Progress towards PT goals:  Progressing toward goals    Frequency    Min 2X/week       Co-evaluation     PT goals addressed during session: Mobility/safety with mobility;Balance;Proper use of DME;Strengthening/ROM        AM-PAC PT 6 Clicks Mobility   Outcome Measure  Help needed turning from your back to your side while in a flat bed without using bedrails?: A Lot Help needed moving from lying on your back to sitting on the side of a flat bed without using bedrails?: A Lot Help needed moving to and from a bed to a chair (including a wheelchair)?: A Lot Help needed standing up from a chair using your arms (e.g., wheelchair or bedside chair)?: A Lot Help needed to walk in hospital room?: A Lot Help needed climbing 3-5 steps with a railing? : Total 6 Click Score: 11    End of Session   Activity Tolerance: Patient tolerated treatment well;Patient limited by fatigue Patient left: in chair;with call bell/phone within reach;with chair alarm set;with family/visitor present Nurse Communication: Mobility status PT Visit Diagnosis: Other abnormalities of gait and mobility (R26.89);Difficulty in walking, not elsewhere classified (R26.2)     Time: 8660-8595 PT Time Calculation (min) (ACUTE ONLY): 25 min  Charges:    $Gait Training: 8-22 mins $Therapeutic Activity: 8-22 mins PT General Charges $$ ACUTE PT VISIT: 1 Visit                     Rankin Essex PTA 06/17/24, 3:36 PM

## 2024-06-17 NOTE — TOC Progression Note (Signed)
 Transition of Care [facility]) - Progression Note    Patient Details  Name: Darryl Roman MRN: [id number] Date of Birth: 11-Dec-1932  Transition of Care [facility]) CM/SW Contact  Lauraine JAYSON Carpen, LCSW Phone Number: 06/17/2024, 11:40 AM  Clinical Narrative: Reviewed bed offers with sons at bedside. They have accepted offer from [facility]. CSW called and confirmed they will have a bed tomorrow if stable. Sons will update their mother and sister.    Expected Discharge Plan: Skilled Nursing Facility Barriers to Discharge: Continued Medical Work up               Expected Discharge Plan and Services     Post Acute Care Choice: Skilled Nursing Facility Living arrangements for the past 2 months: Single Family Home                                       Social Drivers of Health (SDOH) Interventions SDOH Screenings   Food Insecurity: No Food Insecurity (06/14/2024)  Housing: Low Risk  (06/14/2024)  Transportation Needs: No Transportation Needs (06/14/2024)  Utilities: Not At Risk (06/14/2024)  Depression (PHQ2-9): Low Risk  (06/10/2024)  Financial Resource Strain: Low Risk  (02/05/2024)   Received from [facility]  Tobacco Use: Low Risk  (06/12/2024)    Readmission Risk Interventions     No data to display

## 2024-06-18 DIAGNOSIS — C61 Malignant neoplasm of prostate: Secondary | ICD-10-CM | POA: Diagnosis not present

## 2024-06-18 DIAGNOSIS — E785 Hyperlipidemia, unspecified: Secondary | ICD-10-CM | POA: Diagnosis not present

## 2024-06-18 DIAGNOSIS — I159 Secondary hypertension, unspecified: Secondary | ICD-10-CM

## 2024-06-18 DIAGNOSIS — I4891 Unspecified atrial fibrillation: Secondary | ICD-10-CM | POA: Diagnosis not present

## 2024-06-18 LAB — POTASSIUM: Potassium: 3.4 mmol/L — ABNORMAL LOW (ref 3.5–5.1)

## 2024-06-18 MED ORDER — POTASSIUM CHLORIDE CRYS ER 20 MEQ PO TBCR
40.0000 meq | EXTENDED_RELEASE_TABLET | Freq: Once | ORAL | Status: AC
  Administered 2024-06-18: 40 meq via ORAL
  Filled 2024-06-18: qty 2

## 2024-06-18 NOTE — TOC Transition Note (Signed)
 Transition of Care [facility]) - Discharge Note   Patient Details  Name: Darryl Roman MRN: [id number] Date of Birth: October 10, 1932  Transition of Care [facility]) CM/SW Contact:  Lauraine JAYSON Carpen, LCSW Phone Number: 06/18/2024, 12:01 PM   Clinical Narrative:  Patient has orders to discharge to [facility] today. RN will call report to [phone] (Room F-11). [facility] has been arranged and he is 3rd on the list. No further concerns. CSW signing off.   Final next level of care: Skilled Nursing Facility Barriers to Discharge: Barriers Resolved   Patient Goals and CMS Choice     Choice offered to / list presented to : Spouse, Adult Children      Discharge Placement   Existing PASRR number confirmed : 06/16/24          Patient chooses bed at: Other - please specify in the comment section below: ([facility]) Patient to be transferred to facility by: [facility] Name of family member notified: Demondre Aguas Patient and family notified of of transfer: 06/18/24  Discharge Plan and Services Additional resources added to the After Visit Summary for       Post Acute Care Choice: Skilled Nursing Facility                               Social Drivers of Health (SDOH) Interventions SDOH Screenings   Food Insecurity: No Food Insecurity (06/14/2024)  Housing: Low Risk  (06/14/2024)  Transportation Needs: No Transportation Needs (06/14/2024)  Utilities: Not At Risk (06/14/2024)  Depression (PHQ2-9): Low Risk  (06/10/2024)  Financial Resource Strain: Low Risk  (02/05/2024)   Received from [facility]  Tobacco Use: Low Risk  (06/12/2024)     Readmission Risk Interventions     No data to display

## 2024-06-18 NOTE — Discharge Summary (Addendum)
 Physician Discharge Summary   Patient: Darryl Roman MRN: [id number] DOB: Feb 03, 1933  Admit date:     06/12/2024  Discharge date: 06/18/24  Discharge Physician: Leita Blanch   PCP: Rudolpho Norleen BIRCH, MD   Recommendations at discharge:    F/u PCP Dr Rudolpho in 1-2 weeks Pt to establish Card[facility] care [facility] for New onset Afib. F/u 2-3 weeks  Discharge Diagnoses: Principal Problem:   Atrial fibrillation with RVR (HCC) Active Problems:   Prostate cancer (HCC)   Hypertension   Dementia without behavioral disturbance (HCC)   Hyperlipidemia   Darryl Roman is a 89 y.o. male with medical history significant for CVA, hypertension, hyperlipidemia, GERD, COPD, prostate cancer, alcohol use, dementia, and nephrolithiasis. The patient presents with 3 days of increased shortness of breath, cough and weakness.  In the ED the patient was found to be in AF with RVR. HR was initially 150.  Chest x-ray showed findings concerning for pneumonia.     Sepsis secondary to community-acquired pneumonia likely due to bacterial cause --Patient with temperature 101, WBC 11.9, respiratory rate up to 35 and pulse of 103 --Chest x-ray showing findings of left-sided infiltrate --IV ceftriaxone  and azithromycin --completed course --Urinalysis did not show UTI --now afebrile today --sepsis resolved   New onset atrial fibrillation with RVR (HCC) --Currently rate controlled --Dr Dorinda discussed Eliquis  with the patient's family and they have agreed --Was on Cardizem  drip which has been switched to oral diltiazem  --Continue to monitor closely--HR much better --will have pt f/u [facility]   Acute delirium-improving H/o dementia --Patient continues to remain confused but improving --Continue delirium precaution --pt follows with Dr Jannett Fairly   Left knee pain due to osteoarthritis --X-ray of the knee shows findings of osteoarthritis --Dr Dorinda has discussed the case with Dr. Lorelle and  given patient's acute illness they will follow-up as an outpatient for any steroid injection that may be indicated at that time   Hypertension --Continue diltiazem    Dementia behavioral disturbance (HCC) --Continue daily aripiprazole    History of non-metastatic prostate cancer (HCC) --No acute intervention at this time --Recent PSA elevated--pt to f/u with Dr Melanee ([place])   Hyperlipidemia --Continue Crestor  as at home.    overall best at baseline--d/c to rehab. Pt's sons and wife are agreeable.     Family communication :sons at bedside Consults :none CODE STATUS: full DVT Prophylaxis :apixaban     Disposition: Rehabilitation facility Diet recommendation:  Discharge Diet Orders (From admission, onward)     Start     Ordered   06/18/24 0000  Diet - low sodium heart healthy        06/18/24 1116           Cardiac diet DISCHARGE MEDICATION: Allergies as of 06/18/2024       Reactions   Sulfa Antibiotics Other (See Comments)        Medication List     STOP taking these medications    amLODipine 10 MG tablet Commonly known as: NORVASC   aspirin  EC 325 MG tablet   celecoxib  100 MG capsule Commonly known as: CeleBREX    predniSONE 20 MG tablet Commonly known as: DELTASONE       TAKE these medications    apixaban  5 MG Tabs tablet Commonly known as: ELIQUIS  Take 1 tablet (5 mg total) by mouth 2 (two) times daily.   cyanocobalamin 1000 MCG tablet Commonly known as: VITAMIN B12 Take 1,000 mcg by mouth daily.   diltiazem  120 MG 24  hr capsule Commonly known as: CARDIZEM  CD Take 1 capsule (120 mg total) by mouth daily.   donepezil  5 MG tablet Commonly known as: ARICEPT  Take 10 mg by mouth at bedtime.   lidocaine  5 % Commonly known as: Lidoderm  Place 1 patch onto the skin daily. Remove & Discard patch within 12 hours or as directed by MD   losartan  25 MG tablet Commonly known as: COZAAR  Take 25 mg by mouth daily.   memantine  5 MG tablet Commonly  known as: NAMENDA  Take 5 mg by mouth.   mirtazapine  7.5 MG tablet Commonly known as: REMERON  Take 1 tablet (7.5 mg total) by mouth at bedtime as needed (Sleep).   Multi-Vitamin tablet Take 1 tablet by mouth daily.   polyethylene glycol 17 g packet Commonly known as: MIRALAX  / GLYCOLAX  Take 17 g by mouth daily as needed for mild constipation.   rosuvastatin  5 MG tablet Commonly known as: CRESTOR  Take 5 mg by mouth at bedtime.        Contact information for follow-up providers     Custovic, Sabina, DO. Schedule an appointment as soon as possible for a visit in 3 week(s).   Specialty: Cardiology Why: New onset Afib Contact information: [address] [place] [address] [phone]         Rudolpho Norleen BIRCH, MD. Schedule an appointment as soon as possible for a visit in 1 week(s).   Specialty: Internal Medicine Why: Hospital f/u Contact information: [address] [facility] [place] [address] [phone]              Contact information for after-discharge care     Destination     [facility] .   Service: Skilled Nursing Contact information: [address] [place]  [address] [phone]                    Discharge Exam: Filed Weights   06/12/24 1358 06/12/24 1506  Weight: 91.4 kg 90.7 kg   limited GENERAL:  89 y.o.-year-old patient with no acute distress.  LUNGS: Normal breath sounds bilaterally CARDIOVASCULAR: S1, S2 normal. No murmur   EXTREMITIES: No  edema b/l.    NEUROLOGIC: nonfocal  dementia at baseline  Condition at discharge: fair  The results of significant diagnostics from this hospitalization (including imaging, microbiology, ancillary and laboratory) are listed below for reference.   Imaging Studies: DG Knee 1-2 Views Left Result Date: 06/15/2024 CLINICAL DATA:  855384 Pain 144615 102374 Osteoarthritis [id number]. EXAM: LEFT KNEE - 1-2 VIEW COMPARISON:   02/01/2016. FINDINGS: No acute fracture or dislocation. No aggressive osseous lesion. There are degenerative changes of the knee joint in the form of markedly reduced medial tibio-femoral compartment joint space, tibial spiking and tricompartmental osteophytosis. Note is also made of meniscal chondrocalcinosis. No knee effusion or focal soft tissue swelling. No radiopaque foreign bodies. IMPRESSION: *No acute osseous abnormality of the left knee joint. Moderate degenerative changes, as described above. Electronically Signed   By: Ree Molt M.D.   On: 06/15/2024 11:49   DG Chest Port 1 View Result Date: 06/14/2024 CLINICAL DATA:  Fever EXAM: PORTABLE CHEST 1 VIEW COMPARISON:  Chest x-ray 06/12/2024 FINDINGS: There are mild patchy airspace opacities in the left lung base. The lungs are otherwise clear. There is no pleural effusion or pneumothorax. The cardiomediastinal silhouette is within normal limits. No acute fractures are seen. IMPRESSION: Mild patchy airspace opacities in the left lung base, which may represent atelectasis or  infection. Electronically Signed   By: Greig Pique M.D.   On: 06/14/2024 17:53   DG Chest 2 View Result Date: 06/12/2024 EXAM: 2 VIEW(S) XRAY OF THE CHEST 06/12/2024 02:16:07 PM COMPARISON: 04/28/2024 CLINICAL HISTORY: cp. Patient presents with sob for a few days and chest pain that started through the night FINDINGS: LUNGS AND PLEURA: No focal pulmonary opacity. No pulmonary edema. No pleural effusion. No pneumothorax. Changes of COPD are present. HEART AND MEDIASTINUM: No acute abnormality of the cardiac and mediastinal silhouettes. Atherosclerotic calcifications of the aortic arch. BONES AND SOFT TISSUES: No acute osseous abnormality. Multilevel degenerative changes of the thoracic spine. IMPRESSION: 1. No acute findings. 2. Changes of COPD. Electronically signed by: Lonni Necessary MD 06/12/2024 02:20 PM EDT RP Workstation: [id number]    Microbiology: Results for  orders placed or performed during the hospital encounter of 06/12/24  Culture, blood (Routine X 2) w Reflex to ID Panel     Status: None (Preliminary result)   Collection Time: 06/14/24  8:03 PM   Specimen: BLOOD LEFT HAND  Result Value Ref Range Status   Specimen Description BLOOD LEFT HAND  Final   Special Requests   Final    BOTTLES DRAWN AEROBIC AND ANAEROBIC Blood Culture adequate volume   Culture   Final    NO GROWTH 4 DAYS Performed at [facility], [address] [place], [place] [address]    Report Status PENDING  Incomplete  Culture, blood (Routine X 2) w Reflex to ID Panel     Status: None (Preliminary result)   Collection Time: 06/14/24  8:07 PM   Specimen: BLOOD RIGHT HAND  Result Value Ref Range Status   Specimen Description BLOOD RIGHT HAND  Final   Special Requests   Final    BOTTLES DRAWN AEROBIC AND ANAEROBIC Blood Culture adequate volume   Culture   Final    NO GROWTH 4 DAYS Performed at [facility], [address] [place], [place] [address]    Report Status PENDING  Incomplete    Labs: CBC: Recent Labs  Lab 06/13/24 0352 06/14/24 0433 06/15/24 0327 06/16/24 0738 06/17/24 0358  WBC 8.2 11.9* 11.0* 9.4 6.6  NEUTROABS  --  9.0* 8.1* 7.3 4.5  HGB 14.4 16.0 14.8 14.3 12.5*  HCT 40.0 44.9 42.9 41.4 35.2*  MCV 92.6 92.8 94.5 93.5 93.1  PLT 186 221 210 202 199   Basic Metabolic Panel: Recent Labs  Lab 06/13/24 0352 06/14/24 0433 06/15/24 0327 06/16/24 0738 06/17/24 0358 06/18/24 0850  NA 144 143 142 142 142  --   K 3.5 3.9 3.5 3.5 3.3* 3.4*  CL 112* 111 112* 112* 113*  --   CO2 21* 22 21* 21* 22  --   GLUCOSE 112* 131* 137* 139* 119*  --   BUN 20 16 24* 23 26*  --   CREATININE 1.17 1.02 1.14 0.97 0.87  --   CALCIUM  8.4* 8.9 8.4* 8.4* 8.2*  --    Liver Function Tests: Recent Labs  Lab 06/13/24 0352  AST 20  ALT 19  ALKPHOS 85  BILITOT 0.9  PROT 5.7*  ALBUMIN 3.1*    Discharge time spent: greater than 30  minutes.  Signed: Leita Blanch, MD [facility] 06/18/2024

## 2024-06-19 LAB — CULTURE, BLOOD (ROUTINE X 2)
Culture: NO GROWTH
Culture: NO GROWTH
Special Requests: ADEQUATE
Special Requests: ADEQUATE

## 2024-06-19 NOTE — Telephone Encounter (Addendum)
 Per Dr. Melanee follow up with patient to make sure they're aware of PSMA PET scheduled 9/24 at 12:30pm and then sees Dr. Melanee on Fri 10/10 at 2pm.  Outbound call; voice message left.

## 2024-06-22 NOTE — Telephone Encounter (Signed)
 He's not doing too good per spouse; blood pressure was really low a week ago.  Had little pneumonia and a-fib and his blood pressure was low but he's doing okay.  Went to therapy in [place] but they didn't think he'd need to do therapy that day.  Full blown dementia.  Spouse states he hasn't slept in 9-10 days (even while hospitalized).   Asked if he has a fever; spouse indicated he did not when he was released from the hospital.  Asked if patient has a f/u scheduled in the next couple of days; spouse indicated he does not.  Spouse then proceeded to hand phone to son Darryl Roman who stated patient will not be going PSMA PET scheduled 06/24/24 due to full blown dementia.  Darryl Roman asked about last PSA; informed on 06/10/24 it was 8.29.  Informed I would let provider know of the above; son verbalized understanding.

## 2024-06-22 NOTE — Telephone Encounter (Signed)
 Provider informed of conversation below.  Per Dr. Melanee ok we can put a hold on the psma pet scan and tentatively give him an appt with me 2 months from now with labs cbc with diff cmp and psa. depending on how he is doing they can decide if he wants to f/u or not.  Secure chat sent to [facility] to hold off on PSMA PET scheduled 9/24 and tagged [facility] for scheduling changes.

## 2024-06-23 NOTE — Telephone Encounter (Signed)
 Per Dr. Melanee ok we can put a hold on the psma pet scan and tentatively give him an appt with me 2 months from now with labs cbc with diff cmp and psa. depending on how he is doing they can decide if he wants to f/u or not.

## 2024-06-24 ENCOUNTER — Ambulatory Visit

## 2024-07-01 DEATH — deceased

## 2024-07-10 ENCOUNTER — Ambulatory Visit: Admitting: Oncology

## 2024-08-04 ENCOUNTER — Encounter: Payer: Self-pay | Admitting: Oncology

## 2024-08-11 ENCOUNTER — Inpatient Hospital Stay: Attending: Oncology

## 2024-08-11 ENCOUNTER — Inpatient Hospital Stay: Admitting: Oncology

## 2024-08-11 ENCOUNTER — Telehealth: Payer: Self-pay | Admitting: Oncology

## 2024-08-11 NOTE — Telephone Encounter (Signed)
 Called pt to r/s from no show appt today - no answer - no vm set up - [facility]

## 2024-08-13 ENCOUNTER — Encounter: Payer: Self-pay | Admitting: Oncology

## 2024-08-24 ENCOUNTER — Telehealth: Payer: Self-pay | Admitting: Oncology

## 2024-08-24 NOTE — Telephone Encounter (Signed)
 Pt spouse called because she received a no show letter for pt.  Spouse stated that pt passed away on 07/01/2024

## 2024-09-09 ENCOUNTER — Other Ambulatory Visit

## 2024-12-08 ENCOUNTER — Other Ambulatory Visit

## 2024-12-08 ENCOUNTER — Ambulatory Visit: Admitting: Oncology
